# Patient Record
Sex: Female | Born: 1945 | Race: Black or African American | Hispanic: No | Marital: Married | Smoking: Former smoker
Health system: Southern US, Community
[De-identification: ages and names within clinical notes are randomized; demographics above are authoritative.]

## PROBLEM LIST (undated history)

## (undated) DIAGNOSIS — I10 Essential (primary) hypertension: Secondary | ICD-10-CM

## (undated) HISTORY — PX: ABDOMINAL HYSTERECTOMY: SHX81

## (undated) HISTORY — PX: TONSILLECTOMY: SUR1361

## (undated) HISTORY — PX: OTHER SURGICAL HISTORY: SHX169

---

## 2004-09-08 ENCOUNTER — Ambulatory Visit: Payer: Self-pay | Admitting: Internal Medicine

## 2004-09-22 ENCOUNTER — Ambulatory Visit: Payer: Self-pay | Admitting: Psychiatry

## 2004-11-02 ENCOUNTER — Ambulatory Visit: Payer: Self-pay | Admitting: Internal Medicine

## 2004-11-12 ENCOUNTER — Ambulatory Visit: Payer: Self-pay

## 2005-10-07 ENCOUNTER — Inpatient Hospital Stay: Payer: Self-pay | Admitting: Internal Medicine

## 2005-10-07 ENCOUNTER — Other Ambulatory Visit: Payer: Self-pay

## 2005-12-08 ENCOUNTER — Ambulatory Visit: Payer: Self-pay | Admitting: Internal Medicine

## 2006-11-16 ENCOUNTER — Ambulatory Visit: Payer: Self-pay | Admitting: Gastroenterology

## 2006-12-11 ENCOUNTER — Ambulatory Visit: Payer: Self-pay | Admitting: Internal Medicine

## 2006-12-22 ENCOUNTER — Ambulatory Visit: Payer: Self-pay | Admitting: Internal Medicine

## 2007-04-10 ENCOUNTER — Ambulatory Visit: Payer: Self-pay | Admitting: Cardiology

## 2007-12-12 ENCOUNTER — Ambulatory Visit: Payer: Self-pay | Admitting: Internal Medicine

## 2007-12-17 ENCOUNTER — Ambulatory Visit: Payer: Self-pay | Admitting: Internal Medicine

## 2008-08-11 ENCOUNTER — Ambulatory Visit: Payer: Self-pay | Admitting: Internal Medicine

## 2009-09-22 ENCOUNTER — Ambulatory Visit: Payer: Self-pay | Admitting: Internal Medicine

## 2010-09-24 ENCOUNTER — Ambulatory Visit: Payer: Self-pay | Admitting: Internal Medicine

## 2010-10-18 ENCOUNTER — Ambulatory Visit: Payer: Self-pay | Admitting: Gastroenterology

## 2010-11-16 ENCOUNTER — Emergency Department: Payer: Self-pay | Admitting: Emergency Medicine

## 2011-11-01 ENCOUNTER — Ambulatory Visit: Payer: Self-pay | Admitting: Internal Medicine

## 2012-04-17 ENCOUNTER — Emergency Department: Payer: Self-pay | Admitting: Emergency Medicine

## 2012-04-17 LAB — COMPREHENSIVE METABOLIC PANEL
Alkaline Phosphatase: 60 U/L (ref 50–136)
BUN: 12 mg/dL (ref 7–18)
Bilirubin,Total: 0.5 mg/dL (ref 0.2–1.0)
Chloride: 105 mmol/L (ref 98–107)
Creatinine: 0.88 mg/dL (ref 0.60–1.30)
EGFR (African American): >60
EGFR (Non-African Amer.): >60
Glucose: 101 mg/dL — ABNORMAL HIGH (ref 65–99)
Potassium: 3.8 mmol/L (ref 3.5–5.1)
SGOT(AST): 21 U/L (ref 15–37)
SGPT (ALT): 24 U/L (ref 12–78)
Total Protein: 7.8 g/dL (ref 6.4–8.2)

## 2012-04-17 LAB — URINALYSIS, COMPLETE
Blood: NEGATIVE
Ketone: NEGATIVE
Nitrite: NEGATIVE
Ph: 5 (ref 4.5–8.0)
Protein: NEGATIVE
RBC,UR: 2 /HPF (ref 0–5)
Specific Gravity: 1.023 (ref 1.003–1.030)
WBC UR: 1 /HPF (ref 0–5)

## 2012-04-17 LAB — CBC
HCT: 48.2 % — ABNORMAL HIGH (ref 35.0–47.0)
MCH: 28.7 pg (ref 26.0–34.0)
MCHC: 33.3 g/dL (ref 32.0–36.0)
MCV: 86 fL (ref 80–100)
Platelet: 223 10*3/uL (ref 150–440)
RBC: 5.59 10*6/uL — ABNORMAL HIGH (ref 3.80–5.20)
WBC: 10.6 10*3/uL (ref 3.6–11.0)

## 2012-12-13 ENCOUNTER — Ambulatory Visit: Payer: Self-pay | Admitting: Internal Medicine

## 2013-02-21 ENCOUNTER — Ambulatory Visit: Payer: Self-pay | Admitting: Internal Medicine

## 2013-10-13 DIAGNOSIS — R55 Syncope and collapse: Secondary | ICD-10-CM | POA: Insufficient documentation

## 2013-12-03 DIAGNOSIS — M519 Unspecified thoracic, thoracolumbar and lumbosacral intervertebral disc disorder: Secondary | ICD-10-CM | POA: Insufficient documentation

## 2013-12-25 ENCOUNTER — Ambulatory Visit: Payer: Self-pay | Admitting: Internal Medicine

## 2013-12-27 ENCOUNTER — Ambulatory Visit: Payer: Self-pay | Admitting: Internal Medicine

## 2014-07-31 ENCOUNTER — Inpatient Hospital Stay: Payer: Self-pay | Admitting: Internal Medicine

## 2014-07-31 LAB — COMPREHENSIVE METABOLIC PANEL
ALBUMIN: 3.3 g/dL — AB (ref 3.4–5.0)
ALK PHOS: 62 U/L (ref 46–116)
ANION GAP: 6 — AB (ref 7–16)
BILIRUBIN TOTAL: 0.4 mg/dL (ref 0.2–1.0)
BUN: 12 mg/dL (ref 7–18)
CO2: 36 mmol/L — AB (ref 21–32)
CREATININE: 0.91 mg/dL (ref 0.60–1.30)
Calcium, Total: 8.8 mg/dL (ref 8.5–10.1)
Chloride: 100 mmol/L (ref 98–107)
EGFR (African American): >60
EGFR (Non-African Amer.): >60
Glucose: 98 mg/dL (ref 65–99)
Osmolality: 283 (ref 275–301)
Potassium: 3.5 mmol/L (ref 3.5–5.1)
SGOT(AST): 28 U/L (ref 15–37)
SGPT (ALT): 19 U/L (ref 14–63)
SODIUM: 142 mmol/L (ref 136–145)
Total Protein: 7.5 g/dL (ref 6.4–8.2)

## 2014-07-31 LAB — CBC
HCT: 53.3 % — AB (ref 35.0–47.0)
HGB: 17.2 g/dL — AB (ref 12.0–16.0)
MCH: 28.5 pg (ref 26.0–34.0)
MCHC: 32.3 g/dL (ref 32.0–36.0)
MCV: 88 fL (ref 80–100)
Platelet: 210 10*3/uL (ref 150–440)
RBC: 6.04 10*6/uL — ABNORMAL HIGH (ref 3.80–5.20)
RDW: 15.5 % — ABNORMAL HIGH (ref 11.5–14.5)
WBC: 10.2 10*3/uL (ref 3.6–11.0)

## 2014-07-31 LAB — URINALYSIS, COMPLETE
Bacteria: NONE SEEN
Bilirubin,UR: NEGATIVE
Blood: NEGATIVE
GLUCOSE, UR: NEGATIVE mg/dL (ref 0–75)
KETONE: NEGATIVE
Leukocyte Esterase: NEGATIVE
Nitrite: NEGATIVE
PH: 6 (ref 4.5–8.0)
Protein: NEGATIVE
Specific Gravity: 1.006 (ref 1.003–1.030)
Squamous Epithelial: 2

## 2014-07-31 LAB — PRO B NATRIURETIC PEPTIDE: B-Type Natriuretic Peptide: 92 pg/mL (ref 0–125)

## 2014-07-31 LAB — D-DIMER(ARMC): D-Dimer: 490 ng/ml

## 2014-07-31 LAB — TROPONIN I: Troponin-I: <0.02 ng/mL

## 2014-08-01 LAB — CBC WITH DIFFERENTIAL/PLATELET
Basophil #: 0.1 10*3/uL (ref 0.0–0.1)
Basophil %: 0.4 %
Eosinophil #: 0 10*3/uL (ref 0.0–0.7)
Eosinophil %: 0 %
HCT: 53.3 % — ABNORMAL HIGH (ref 35.0–47.0)
HGB: 16.9 g/dL — AB (ref 12.0–16.0)
LYMPHS PCT: 9.2 %
Lymphocyte #: 1.2 10*3/uL (ref 1.0–3.6)
MCH: 28 pg (ref 26.0–34.0)
MCHC: 31.7 g/dL — ABNORMAL LOW (ref 32.0–36.0)
MCV: 88 fL (ref 80–100)
MONOS PCT: 1.5 %
Monocyte #: 0.2 x10 3/mm (ref 0.2–0.9)
Neutrophil #: 11.9 10*3/uL — ABNORMAL HIGH (ref 1.4–6.5)
Neutrophil %: 88.9 %
Platelet: 216 10*3/uL (ref 150–440)
RBC: 6.04 10*6/uL — ABNORMAL HIGH (ref 3.80–5.20)
RDW: 15.8 % — ABNORMAL HIGH (ref 11.5–14.5)
WBC: 13.4 10*3/uL — ABNORMAL HIGH (ref 3.6–11.0)

## 2014-08-01 LAB — BASIC METABOLIC PANEL
Anion Gap: 8 (ref 7–16)
BUN: 18 mg/dL (ref 7–18)
CALCIUM: 9.1 mg/dL (ref 8.5–10.1)
CREATININE: 1.05 mg/dL (ref 0.60–1.30)
Chloride: 98 mmol/L (ref 98–107)
Co2: 32 mmol/L (ref 21–32)
EGFR (Non-African Amer.): 55 — ABNORMAL LOW
Glucose: 157 mg/dL — ABNORMAL HIGH (ref 65–99)
OSMOLALITY: 281 (ref 275–301)
Potassium: 3.5 mmol/L (ref 3.5–5.1)
Sodium: 138 mmol/L (ref 136–145)

## 2014-11-02 NOTE — H&P (Signed)
PATIENT NAME:  Katie Caldwell, Katie Caldwell MR#:  [id number] DATE OF BIRTH:  Jun 04, 1946  DATE OF ADMISSION:  07/31/2014  PRIMARY CARE PHYSICIAN: Emily Filbert, MD, [facility]   REFERRING PHYSICIAN:  Ferman Hamming, MD from Emergency Room.   CHIEF COMPLAINT: Shortness of breath.   HISTORY OF PRESENT ILLNESS: A 69 year old female who has a history of hypertension and she is a smoker of half pack of cigarettes a day for many years now, never diagnosed with chronic obstructive pulmonary disease or any respiratory issues.  For the last few days, 3 to 4 days, she has complained of a dry cough and feeling short of breath so she called Dr. Ammie Ferrier office and went over there.  She was found to be hypoxic in the high 70s in the office and so was started on oxygen and sent to Emergency Room for further evaluation.  In the ER, her work-up came out to be negative. Chest x-ray clear, but she had some wheezing and so given respiratory treatments and given to hospitalist team for further management.  On further questioning, she denies any chest pain, fever or chills.   REVIEW OF SYSTEMS:  CONSTITUTIONAL: Negative for fever, fatigue, weakness, pain or weight loss.  EYES: No blurring, double vision, discharge or redness.  EARS, NOSE, THROAT: No tinnitus, ear pain or hearing loss.  RESPIRATORY: No cough, wheezing, hemoptysis, or shortness of breath.  CARDIOVASCULAR: No chest pain, orthopnea, edema, arrhythmia or palpitations.  GASTROINTESTINAL: No nausea, vomiting, diarrhea, or abdominal pain.  GENITOURINARY: No dysuria, hematuria, increased frequency.  ENDOCRINE: No heat or cold intolerance. No excessive sweating.  SKIN: No acne, rashes, or lesions.  MUSCULOSKELETAL: No pain or swelling in the joints.  NEUROLOGICAL: No numbness, weakness, tremor or vertigo.  PSYCHIATRIC: No anxiety, insomnia, bipolar disorder.   PAST MEDICAL HISTORY:   Hypertension.   PAST SURGICAL HISTORY: Hysterectomy long ago.   SOCIAL  HISTORY: She is a smoker, half pack of cigarettes every day. Denies drinking alcohol or doing any illegal drug use.  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  Lives with her husband.   FAMILY HISTORY: Positive for diabetes in her father.   MEDICATIONS:  1.  Lisinopril 20 mg oral once a day.  2.  Furosemide 20 mg oral once a day.  3.  Amlodipine 10 mg once a day.  4.  Acetaminophen-oxycodone 325-5 mg 2 times a day.   PHYSICAL EXAMINATION:  VITAL SIGNS: In ER, temperature 97.9, pulse is 86, respirations 18, blood pressure 155/84, pulse oximetry is 77% on room air which came up to 92% with oxygen supplementation.  GENERAL: The patient is fully alert and oriented to time, place, and person. Does not appear in any acute distress at this time.  HEENT: Head and neck atraumatic. Conjunctivae pink. Oral mucosa moist.  NECK: Supple. No JVD. Thyroid nontender.  RESPIRATORY: Bilateral equal air entry, expiratory wheezing present. No crepitation.  CARDIOVASCULAR: S1, S2 present, regular. No murmur.  ABDOMEN: Soft, nontender. Bowel sounds present. No organomegaly.  SKIN: No acne, rashes, or lesions. MUSCULOSKELETAL: No pain or swelling in the joints.  NEUROLOGICAL: No numbness, weakness. Power 5/5 in all 4 limbs. No tremor or rigidity. Follows commands.  PSYCHIATRIC: Not any acute psychiatric illness at this time.  JOINTS: No swelling or tenderness.   IMPORTANT LABORATORY RESULTS:  BNP is 92, BUN 12, creatinine 0.91, sodium 142, potassium 3.5, chloride 100 and calcium is 8.8.  Total protein is  7.5, bilirubin 0.4, alkaline phosphate 62, SGOT 28 and SGPT is 19.  Troponin less than 0.02.  WBC 10.2, hemoglobin 17.2, platelet count 110,000. MCV is 88.  D-dimer is 490.  Urinalysis is grossly negative. Chest x-ray, portable, single view, shows no acute cardiopulmonary abnormality.   ASSESSMENT AND PLAN: A 69 year old female who is a smoker but  was not diagnosed with chronic obstructive pulmonary disease in the past, came to the Emergency Room with shortness of breath sent from primary care doctor's office.  1.  Acute hypoxic respiratory failure, oxygen saturation 77% on room air. This is secondary to chronic obstructive pulmonary disease.  We will supplement oxygen currently and treat underlying cause.  2.  Chronic obstructive pulmonary disease extubation.  We will do IV steroid and nebulizer treatment as needed. This is a new diagnosis of chronic obstructive pulmonary disease, so I would recommend to follow with a pulmonologist once she recovers and this can be done as an outpatient.     3.  Hypertension. We will continue amlodipine and lisinopril as she is taking.  She was also taking furosemide, but does not have any cardiac issues as per her and she keeps drinking a lot of water because she feels thirsty after dry having to pee too much after furosemide, so I will hold furosemide and I encourage her not to drink too much water.  She understands and she will follow that.  4.  Smoking cessation counseling is done for 5 minutes and she understands will not smoke now, does not need any nicotine inhaler in the hospital.        ____________________________ Ceasar Lund. Anselm Jungling, MD vgv:DT D: 07/31/2014 13:43:57 ET T: 07/31/2014 14:01:54 ET JOB#: [id number]  cc: Ceasar Lund. Anselm Jungling, MD, <Dictator> Rusty Aus, MD  Vaughan Basta MD ELECTRONICALLY SIGNED 08/18/2014 9:58

## 2014-11-02 NOTE — Discharge Summary (Signed)
PATIENT NAME:  Katie Caldwell, WOLBERT MR#:  [id number] DATE OF BIRTH:  [birth year]  DATE OF ADMISSION:  07/31/2014 DATE OF DISCHARGE:  08/01/2014  DISCHARGE DIAGNOSES: 1. Respiratory failure.  2. Hypertension.  3. Chronic arthritis.   DISCHARGE MEDICATIONS: 1. Ceftin 250 mg b.i.d.  2. Z-Pak. 3. Prednisone taper.  4. Lisinopril 20 mg daily.  5. Furosemide 20 mg daily.  6. Amlodipine 10 mg daily.  7. Percocet 5/325 b.i.d.   REASON FOR ADMISSION: A 69 year old female presents with respiratory failure. Please see H and P for HPI, past medical history, and physical examination.   HOSPITAL COURSE: The patient was admitted. Pulse oximetry initially 77% with steroids AND antibiotics. Her lungs cleared dramatically, and chest x-ray showed no pneumonia. She will be going home with prednisone taper and antibiotics. Overall prognosis is good   ____________________________ Rusty Aus, MD mfm:mw D: 08/01/2014 08:10:07 ET T: 08/01/2014 12:20:41 ET JOB#: [id number]  cc: Rusty Aus, MD, <Dictator> Suhayb Anzalone Roselee Culver MD ELECTRONICALLY SIGNED 08/04/2014 13:19

## 2014-12-10 ENCOUNTER — Other Ambulatory Visit: Payer: Self-pay | Admitting: Internal Medicine

## 2014-12-10 DIAGNOSIS — Z1231 Encounter for screening mammogram for malignant neoplasm of breast: Secondary | ICD-10-CM

## 2014-12-29 ENCOUNTER — Other Ambulatory Visit: Payer: Self-pay | Admitting: Internal Medicine

## 2014-12-29 ENCOUNTER — Ambulatory Visit
Admission: RE | Admit: 2014-12-29 | Discharge: 2014-12-29 | Disposition: A | Discharge status: Home / Self Care | Payer: Medicare PPO | Source: Ambulatory Visit | Attending: Internal Medicine | Admitting: Internal Medicine

## 2014-12-29 DIAGNOSIS — Z1231 Encounter for screening mammogram for malignant neoplasm of breast: Secondary | ICD-10-CM | POA: Insufficient documentation

## 2015-12-25 DIAGNOSIS — E538 Deficiency of other specified B group vitamins: Secondary | ICD-10-CM | POA: Insufficient documentation

## 2015-12-30 ENCOUNTER — Other Ambulatory Visit: Payer: Self-pay | Admitting: Internal Medicine

## 2015-12-30 DIAGNOSIS — Z1231 Encounter for screening mammogram for malignant neoplasm of breast: Secondary | ICD-10-CM

## 2016-01-14 ENCOUNTER — Other Ambulatory Visit: Payer: Self-pay | Admitting: Internal Medicine

## 2016-01-14 ENCOUNTER — Ambulatory Visit
Admission: RE | Admit: 2016-01-14 | Discharge: 2016-01-14 | Disposition: A | Discharge status: Home / Self Care | Payer: Medicare PPO | Source: Ambulatory Visit | Attending: Internal Medicine | Admitting: Internal Medicine

## 2016-01-14 DIAGNOSIS — Z1231 Encounter for screening mammogram for malignant neoplasm of breast: Secondary | ICD-10-CM

## 2016-01-29 ENCOUNTER — Encounter: Payer: Self-pay | Admitting: *Deleted

## 2016-02-01 ENCOUNTER — Encounter
Admission: RE | Disposition: A | Discharge status: Home / Self Care | Payer: Self-pay | Source: Ambulatory Visit | Attending: Gastroenterology

## 2016-02-01 ENCOUNTER — Ambulatory Visit: Payer: Medicare PPO | Admitting: Certified Registered"

## 2016-02-01 ENCOUNTER — Encounter: Payer: Self-pay | Admitting: *Deleted

## 2016-02-01 ENCOUNTER — Ambulatory Visit
Admission: RE | Admit: 2016-02-01 | Discharge: 2016-02-01 | Disposition: A | Discharge status: Home / Self Care | Payer: Medicare PPO | Source: Ambulatory Visit | Attending: Gastroenterology | Admitting: Gastroenterology

## 2016-02-01 DIAGNOSIS — K644 Residual hemorrhoidal skin tags: Secondary | ICD-10-CM | POA: Insufficient documentation

## 2016-02-01 DIAGNOSIS — I10 Essential (primary) hypertension: Secondary | ICD-10-CM | POA: Diagnosis not present

## 2016-02-01 DIAGNOSIS — K5730 Diverticulosis of large intestine without perforation or abscess without bleeding: Secondary | ICD-10-CM | POA: Insufficient documentation

## 2016-02-01 DIAGNOSIS — Z86010 Personal history of colonic polyps: Secondary | ICD-10-CM | POA: Diagnosis present

## 2016-02-01 DIAGNOSIS — F17200 Nicotine dependence, unspecified, uncomplicated: Secondary | ICD-10-CM | POA: Diagnosis not present

## 2016-02-01 DIAGNOSIS — Z79899 Other long term (current) drug therapy: Secondary | ICD-10-CM | POA: Diagnosis not present

## 2016-02-01 DIAGNOSIS — K621 Rectal polyp: Secondary | ICD-10-CM | POA: Insufficient documentation

## 2016-02-01 DIAGNOSIS — J449 Chronic obstructive pulmonary disease, unspecified: Secondary | ICD-10-CM | POA: Insufficient documentation

## 2016-02-01 HISTORY — PX: COLONOSCOPY WITH PROPOFOL: SHX5780

## 2016-02-01 HISTORY — DX: Essential (primary) hypertension: I10

## 2016-02-01 SURGERY — COLONOSCOPY WITH PROPOFOL
Anesthesia: General

## 2016-02-01 MED ORDER — LIDOCAINE 2% (20 MG/ML) 5 ML SYRINGE
INTRAMUSCULAR | Status: DC | PRN
  Administered 2016-02-01: 50 mg via INTRAVENOUS

## 2016-02-01 MED ORDER — MIDAZOLAM HCL 5 MG/5ML IJ SOLN
INTRAMUSCULAR | Status: DC | PRN
  Administered 2016-02-01: 1 mg via INTRAVENOUS

## 2016-02-01 MED ORDER — SODIUM CHLORIDE 0.9 % IV SOLN
INTRAVENOUS | Status: DC
  Administered 2016-02-01: 1000 mL via INTRAVENOUS

## 2016-02-01 MED ORDER — SODIUM CHLORIDE 0.9 % IV SOLN: INTRAVENOUS | Status: DC

## 2016-02-01 MED ORDER — PROPOFOL 500 MG/50ML IV EMUL
INTRAVENOUS | Status: DC | PRN
Start: 2016-02-01 — End: 2016-02-01
  Administered 2016-02-01: 120 ug/kg/min via INTRAVENOUS

## 2016-02-01 MED ORDER — PROPOFOL 10 MG/ML IV BOLUS
INTRAVENOUS | Status: DC | PRN
  Administered 2016-02-01: 40 mg via INTRAVENOUS
  Administered 2016-02-01: 70 mg via INTRAVENOUS
  Administered 2016-02-01: 30 mg via INTRAVENOUS

## 2016-02-01 NOTE — Anesthesia Postprocedure Evaluation (Signed)
Anesthesia Post Note  Patient: Katie Caldwell  Procedure(s) Performed: Procedure(s) (LRB): COLONOSCOPY WITH PROPOFOL (N/A)  Patient location during evaluation: PACU Anesthesia Type: General Level of consciousness: awake Pain management: satisfactory to patient Vital Signs Assessment: post-procedure vital signs reviewed and stable Respiratory status: respiratory function stable Cardiovascular status: stable Anesthetic complications: no    Last Vitals:  Vitals:   02/01/16 0922 02/01/16 1151  BP: (!) 146/83 128/77  Pulse: 71 78  Resp: 20 18  Temp: (!) 36 C     Last Pain:  Vitals:   02/01/16 0922  TempSrc: Tympanic                 VAN STAVEREN,Jazmon Kos

## 2016-02-01 NOTE — H&P (Signed)
Outpatient short stay form Pre-procedure 02/01/2016 11:12 AM Lollie Sails MD  Primary Physician: Dr. Emily Filbert  Reason for visit:  Colonoscopy  History of present illness:  Patient is a 70 year old female presenting today for colonoscopy. Her last procedure was done 09/27/2010 and distended well for follow-up. He tolerated prep well. She denies use of any over-the-counter aspirin products or blood thinning agents.    Current Facility-Administered Medications:  .  0.9 %  sodium chloride infusion, , Intravenous, Continuous, Lollie Sails, MD, Last Rate: 20 mL/hr at 02/01/16 0943, 1,000 mL at 02/01/16 0943 .  0.9 %  sodium chloride infusion, , Intravenous, Continuous, Lollie Sails, MD  Prescriptions Prior to Admission  Medication Sig Dispense Refill Last Dose  . amLODipine (NORVASC) 10 MG tablet Take 10 mg by mouth daily.     . Cholecalciferol 2000 units TABS Take by mouth.     . Cyanocobalamin (VITAMIN B 12) 100 MCG LOZG Take 1,000 mcg by mouth.     . furosemide (LASIX) 20 MG tablet Take 20 mg by mouth.     . oxyCODONE-acetaminophen (PERCOCET/ROXICET) 5-325 MG tablet Take by mouth every 4 (four) hours as needed for severe pain.     Marland Kitchen triamterene-hydrochlorothiazide (DYAZIDE) 37.5-25 MG capsule Take 1 capsule by mouth daily.        Allergies  Allergen Reactions  . Micardis [Telmisartan]      Past Medical History:  Diagnosis Date  . Hypertension     Review of systems:      Physical Exam    Heart and lungs: Regular rate and rhythm without rub or gallop, lungs are bilaterally clear.    HEENT: Normocephalic atraumatic eyes are anicteric    Other:     Pertinant exam for procedure: Soft nontender nondistended bowel sounds positive normoactive.    Planned proceedures: Colonoscopy and indicated procedures. I have discussed the risks benefits and complications of procedures to include not limited to bleeding, infection, perforation and the risk of sedation and  the patient wishes to proceed.    Lollie Sails, MD Gastroenterology 02/01/2016  11:12 AM

## 2016-02-01 NOTE — Transfer of Care (Signed)
Immediate Anesthesia Transfer of Care Note  Patient: Katie Caldwell  Procedure(s) Performed: Procedure(s): COLONOSCOPY WITH PROPOFOL (N/A)  Patient Location: Endoscopy Unit  Anesthesia Type:General  Level of Consciousness: awake  Airway & Oxygen Therapy: Patient Spontanous Breathing and Patient connected to nasal cannula oxygen  Post-op Assessment: Report given to RN and Post -op Vital signs reviewed and stable  Post vital signs: Reviewed  Last Vitals:  Vitals:   02/01/16 0922 02/01/16 1151  BP: (!) 146/83 128/77  Pulse: 71 78  Resp: 20 18  Temp: (!) 36 C     Last Pain:  Vitals:   02/01/16 0922  TempSrc: Tympanic         Complications: No apparent anesthesia complications

## 2016-02-01 NOTE — Transfer of Care (Signed)
Immediate Anesthesia Transfer of Care Note  Patient: Katie Caldwell  Procedure(s) Performed: Procedure(s): COLONOSCOPY WITH PROPOFOL (N/A)  Patient Location: Endoscopy Unit  Anesthesia Type:General  Level of Consciousness: awake and alert   Airway & Oxygen Therapy: Patient Spontanous Breathing and Patient connected to nasal cannula oxygen  Post-op Assessment: Report given to RN and Post -op Vital signs reviewed and stable  Post vital signs: Reviewed  Last Vitals:  Vitals:   02/01/16 0922  BP: (!) 146/83  Pulse: 71  Resp: 20  Temp: (!) 36 C    Last Pain:  Vitals:   02/01/16 0922  TempSrc: Tympanic         Complications: No apparent anesthesia complications

## 2016-02-01 NOTE — Op Note (Signed)
[facility] Gastroenterology Patient Name: Katie Caldwell Procedure Date: 02/01/2016 11:19 AM MRN: [id number] Account #: [id number] Date of Birth: [birth year] Admit Type: Outpatient Age: 70 Room: [facility] ENDO ROOM 1 Gender: Female Note Status: Finalized Procedure:            Colonoscopy Indications:          Personal history of colonic polyps Providers:            Lollie Sails, MD Referring MD:         Rusty Aus, MD (Referring MD) Medicines:            Monitored Anesthesia Care Complications:        No immediate complications. Procedure:            Pre-Anesthesia Assessment:                       - ASA Grade Assessment: III - A patient with severe                        systemic disease.                       After obtaining informed consent, the colonoscope was                        passed under direct vision. Throughout the procedure,                        the patient's blood pressure, pulse, and oxygen                        saturations were monitored continuously. The                        Colonoscope was introduced through the anus and                        advanced to the the cecum, identified by appendiceal                        orifice and ileocecal valve. The quality of the bowel                        preparation was fair. Findings:      A less than 1 mm polyp was found in the rectum. The polyp was sessile.       The polyp was removed with a cold biopsy forceps. Resection and       retrieval were complete.      Multiple small-mouthed diverticula were found in the sigmoid colon,       descending colon, transverse colon and ascending colon.      The retroflexed view of the distal rectum and anal verge was normal and       showed no anal or rectal abnormalities.      The digital rectal exam was normal.      The perianal exam findings include skin tags. Impression:           - Preparation of the colon was fair.                       - One  less than 1 mm polyp in the rectum, removed with                        a cold biopsy forceps. Resected and retrieved.                       - Diverticulosis in the sigmoid colon, in the                        descending colon, in the transverse colon and in the                        ascending colon.                       - The distal rectum and anal verge are normal on                        retroflexion view.                       - Perianal skin tags found on perianal exam. Recommendation:       - Discharge patient to home. Procedure Code(s):    --- Professional ---                       [id number], Colonoscopy, flexible; with biopsy, single or                        multiple Diagnosis Code(s):    --- Professional ---                       K62.1, Rectal polyp                       K64.4, Residual hemorrhoidal skin tags                       Z86.010, Personal history of colonic polyps                       K57.30, Diverticulosis of large intestine without                        perforation or abscess without bleeding CPT copyright 2016 American Medical Association. All rights reserved. The codes documented in this report are preliminary and upon coder review may  be revised to meet current compliance requirements. Lollie Sails, MD 02/01/2016 11:48:08 AM This report has been signed electronically. Number of Addenda: 0 Note Initiated On: 02/01/2016 11:19 AM Scope Withdrawal Time: 0 hours 11 minutes 12 seconds  Total Procedure Duration: 0 hours 21 minutes 37 seconds       [facility]

## 2016-02-01 NOTE — Anesthesia Preprocedure Evaluation (Signed)
Anesthesia Evaluation  Patient identified by MRN, date of birth, ID band Patient awake    Reviewed: Allergy & Precautions, NPO status , Patient's Chart, lab work & pertinent test results  Airway Mallampati: III       Dental  (+) Teeth Intact   Pulmonary COPD, Current Smoker,     + decreased breath sounds      Cardiovascular hypertension, Pt. on medications  Rhythm:Regular     Neuro/Psych    GI/Hepatic Neg liver ROS,   Endo/Other  Morbid obesity  Renal/GU      Musculoskeletal negative musculoskeletal ROS (+)   Abdominal (+) + obese,   Peds  Hematology   Anesthesia Other Findings   Reproductive/Obstetrics                             Anesthesia Physical Anesthesia Plan  ASA: III  Anesthesia Plan: General   Post-op Pain Management:    Induction: Intravenous  Airway Management Planned: Natural Airway and Nasal Cannula  Additional Equipment:   Intra-op Plan:   Post-operative Plan:   Informed Consent: I have reviewed the patients History and Physical, chart, labs and discussed the procedure including the risks, benefits and alternatives for the proposed anesthesia with the patient or authorized representative who has indicated his/her understanding and acceptance.     Plan Discussed with: CRNA  Anesthesia Plan Comments:         Anesthesia Quick Evaluation

## 2016-02-02 ENCOUNTER — Encounter: Payer: Self-pay | Admitting: Gastroenterology

## 2016-02-02 LAB — SURGICAL PATHOLOGY

## 2016-12-05 DIAGNOSIS — E782 Mixed hyperlipidemia: Secondary | ICD-10-CM | POA: Insufficient documentation

## 2017-01-10 ENCOUNTER — Other Ambulatory Visit: Payer: Self-pay | Admitting: Internal Medicine

## 2017-01-10 DIAGNOSIS — Z1231 Encounter for screening mammogram for malignant neoplasm of breast: Secondary | ICD-10-CM

## 2017-01-20 ENCOUNTER — Ambulatory Visit
Admission: RE | Admit: 2017-01-20 | Discharge: 2017-01-20 | Disposition: A | Discharge status: Home / Self Care | Payer: Medicare PPO | Source: Ambulatory Visit | Attending: Internal Medicine | Admitting: Internal Medicine

## 2017-01-20 DIAGNOSIS — Z1231 Encounter for screening mammogram for malignant neoplasm of breast: Secondary | ICD-10-CM | POA: Insufficient documentation

## 2018-01-25 ENCOUNTER — Other Ambulatory Visit: Payer: Self-pay | Admitting: Internal Medicine

## 2018-01-25 DIAGNOSIS — Z1231 Encounter for screening mammogram for malignant neoplasm of breast: Secondary | ICD-10-CM

## 2018-02-13 ENCOUNTER — Ambulatory Visit
Admission: RE | Admit: 2018-02-13 | Discharge: 2018-02-13 | Disposition: A | Discharge status: Home / Self Care | Payer: Medicare PPO | Source: Ambulatory Visit | Attending: Internal Medicine | Admitting: Internal Medicine

## 2018-02-13 DIAGNOSIS — Z1231 Encounter for screening mammogram for malignant neoplasm of breast: Secondary | ICD-10-CM | POA: Insufficient documentation

## 2018-02-15 ENCOUNTER — Other Ambulatory Visit: Payer: Self-pay | Admitting: Internal Medicine

## 2018-02-15 DIAGNOSIS — N6320 Unspecified lump in the left breast, unspecified quadrant: Secondary | ICD-10-CM

## 2018-02-15 DIAGNOSIS — R928 Other abnormal and inconclusive findings on diagnostic imaging of breast: Secondary | ICD-10-CM

## 2018-03-20 ENCOUNTER — Ambulatory Visit
Admission: RE | Admit: 2018-03-20 | Discharge: 2018-03-20 | Disposition: A | Discharge status: Home / Self Care | Payer: Medicare PPO | Source: Ambulatory Visit | Attending: Internal Medicine | Admitting: Internal Medicine

## 2018-03-20 DIAGNOSIS — N6320 Unspecified lump in the left breast, unspecified quadrant: Secondary | ICD-10-CM | POA: Insufficient documentation

## 2018-03-20 DIAGNOSIS — R928 Other abnormal and inconclusive findings on diagnostic imaging of breast: Secondary | ICD-10-CM

## 2019-07-11 DIAGNOSIS — E559 Vitamin D deficiency, unspecified: Secondary | ICD-10-CM | POA: Insufficient documentation

## 2019-07-11 DIAGNOSIS — C4371 Malignant melanoma of right lower limb, including hip: Secondary | ICD-10-CM

## 2019-07-11 HISTORY — DX: Malignant melanoma of right lower limb, including hip: C43.71

## 2019-12-30 ENCOUNTER — Inpatient Hospital Stay
Admission: EM | Admit: 2019-12-30 | Discharge: 2020-01-03 | DRG: 640 | Disposition: A | Discharge status: Home Health | Payer: Medicare PPO | Source: Ambulatory Visit | Attending: Internal Medicine | Admitting: Internal Medicine

## 2019-12-30 DIAGNOSIS — E878 Other disorders of electrolyte and fluid balance, not elsewhere classified: Secondary | ICD-10-CM | POA: Diagnosis present

## 2019-12-30 DIAGNOSIS — F10231 Alcohol dependence with withdrawal delirium: Secondary | ICD-10-CM | POA: Diagnosis not present

## 2019-12-30 DIAGNOSIS — Z90710 Acquired absence of both cervix and uterus: Secondary | ICD-10-CM

## 2019-12-30 DIAGNOSIS — R1319 Other dysphagia: Secondary | ICD-10-CM

## 2019-12-30 DIAGNOSIS — G8929 Other chronic pain: Secondary | ICD-10-CM | POA: Diagnosis present

## 2019-12-30 DIAGNOSIS — Z6824 Body mass index (BMI) 24.0-24.9, adult: Secondary | ICD-10-CM

## 2019-12-30 DIAGNOSIS — I248 Other forms of acute ischemic heart disease: Secondary | ICD-10-CM | POA: Diagnosis present

## 2019-12-30 DIAGNOSIS — Z803 Family history of malignant neoplasm of breast: Secondary | ICD-10-CM

## 2019-12-30 DIAGNOSIS — K2920 Alcoholic gastritis without bleeding: Secondary | ICD-10-CM | POA: Diagnosis present

## 2019-12-30 DIAGNOSIS — E876 Hypokalemia: Secondary | ICD-10-CM | POA: Diagnosis not present

## 2019-12-30 DIAGNOSIS — I10 Essential (primary) hypertension: Secondary | ICD-10-CM

## 2019-12-30 DIAGNOSIS — E860 Dehydration: Secondary | ICD-10-CM | POA: Diagnosis present

## 2019-12-30 DIAGNOSIS — E785 Hyperlipidemia, unspecified: Secondary | ICD-10-CM | POA: Diagnosis present

## 2019-12-30 DIAGNOSIS — R778 Other specified abnormalities of plasma proteins: Secondary | ICD-10-CM

## 2019-12-30 DIAGNOSIS — F17220 Nicotine dependence, chewing tobacco, uncomplicated: Secondary | ICD-10-CM | POA: Diagnosis present

## 2019-12-30 DIAGNOSIS — Z20822 Contact with and (suspected) exposure to covid-19: Secondary | ICD-10-CM | POA: Diagnosis present

## 2019-12-30 DIAGNOSIS — Q398 Other congenital malformations of esophagus: Secondary | ICD-10-CM

## 2019-12-30 DIAGNOSIS — K760 Fatty (change of) liver, not elsewhere classified: Secondary | ICD-10-CM | POA: Diagnosis present

## 2019-12-30 DIAGNOSIS — Z79899 Other long term (current) drug therapy: Secondary | ICD-10-CM

## 2019-12-30 DIAGNOSIS — N179 Acute kidney failure, unspecified: Secondary | ICD-10-CM | POA: Diagnosis present

## 2019-12-30 DIAGNOSIS — R531 Weakness: Secondary | ICD-10-CM | POA: Diagnosis present

## 2019-12-30 DIAGNOSIS — K219 Gastro-esophageal reflux disease without esophagitis: Secondary | ICD-10-CM | POA: Diagnosis present

## 2019-12-30 DIAGNOSIS — M5136 Other intervertebral disc degeneration, lumbar region: Secondary | ICD-10-CM | POA: Diagnosis present

## 2019-12-30 DIAGNOSIS — E46 Unspecified protein-calorie malnutrition: Secondary | ICD-10-CM | POA: Diagnosis present

## 2019-12-30 DIAGNOSIS — G9341 Metabolic encephalopathy: Secondary | ICD-10-CM | POA: Diagnosis not present

## 2019-12-30 DIAGNOSIS — R634 Abnormal weight loss: Secondary | ICD-10-CM | POA: Diagnosis present

## 2019-12-30 DIAGNOSIS — R109 Unspecified abdominal pain: Secondary | ICD-10-CM

## 2019-12-30 DIAGNOSIS — R7989 Other specified abnormal findings of blood chemistry: Secondary | ICD-10-CM | POA: Diagnosis present

## 2019-12-30 DIAGNOSIS — R1314 Dysphagia, pharyngoesophageal phase: Secondary | ICD-10-CM | POA: Diagnosis present

## 2019-12-30 DIAGNOSIS — Z888 Allergy status to other drugs, medicaments and biological substances status: Secondary | ICD-10-CM

## 2019-12-30 DIAGNOSIS — Z01818 Encounter for other preprocedural examination: Secondary | ICD-10-CM

## 2019-12-30 LAB — CBC WITH DIFFERENTIAL/PLATELET
Abs Immature Granulocytes: 0.05 10*3/uL (ref 0.00–0.07)
Basophils Absolute: 0 10*3/uL (ref 0.0–0.1)
Basophils Relative: 1 %
Eosinophils Absolute: 0 10*3/uL (ref 0.0–0.5)
Eosinophils Relative: 0 %
HCT: 35.7 % — ABNORMAL LOW (ref 36.0–46.0)
Hemoglobin: 12.6 g/dL (ref 12.0–15.0)
Immature Granulocytes: 1 %
Lymphocytes Relative: 29 %
Lymphs Abs: 2.4 10*3/uL (ref 0.7–4.0)
MCH: 31.1 pg (ref 26.0–34.0)
MCHC: 35.3 g/dL (ref 30.0–36.0)
MCV: 88.1 fL (ref 80.0–100.0)
Monocytes Absolute: 0.7 10*3/uL (ref 0.1–1.0)
Monocytes Relative: 9 %
Neutro Abs: 5.2 10*3/uL (ref 1.7–7.7)
Neutrophils Relative %: 60 %
Platelets: 240 10*3/uL (ref 150–400)
RBC: 4.05 MIL/uL (ref 3.87–5.11)
RDW: 13.5 % (ref 11.5–15.5)
WBC: 8.5 10*3/uL (ref 4.0–10.5)
nRBC: 0 % (ref 0.0–0.2)

## 2019-12-30 LAB — MAGNESIUM: Magnesium: 1.4 mg/dL — ABNORMAL LOW (ref 1.7–2.4)

## 2019-12-30 LAB — BASIC METABOLIC PANEL
Anion gap: 21 — ABNORMAL HIGH (ref 5–15)
BUN: 22 mg/dL (ref 8–23)
CO2: 35 mmol/L — ABNORMAL HIGH (ref 22–32)
Calcium: 8.5 mg/dL — ABNORMAL LOW (ref 8.9–10.3)
Chloride: 79 mmol/L — ABNORMAL LOW (ref 98–111)
Creatinine, Ser: 1.61 mg/dL — ABNORMAL HIGH (ref 0.44–1.00)
GFR calc Af Amer: 36 mL/min — ABNORMAL LOW (ref >60)
GFR calc non Af Amer: 31 mL/min — ABNORMAL LOW (ref >60)
Glucose, Bld: 114 mg/dL — ABNORMAL HIGH (ref 70–99)
Potassium: <2 mmol/L — CL (ref 3.5–5.1)
Sodium: 135 mmol/L (ref 135–145)

## 2019-12-30 LAB — TROPONIN I (HIGH SENSITIVITY)
Troponin I (High Sensitivity): 70 ng/L — ABNORMAL HIGH (ref <18)
Troponin I (High Sensitivity): 64 ng/L — ABNORMAL HIGH (ref <18)

## 2019-12-30 MED ORDER — POTASSIUM CHLORIDE 10 MEQ/100ML IV SOLN
10.0000 meq | INTRAVENOUS | Status: AC
  Administered 2019-12-30 (×4): 10 meq via INTRAVENOUS
  Filled 2019-12-30 (×3): qty 100

## 2019-12-30 MED ORDER — VITAMIN B-12 1000 MCG PO TABS
1000.0000 ug | ORAL_TABLET | Freq: Every day | ORAL | Status: DC
  Administered 2019-12-31 – 2020-01-03 (×4): 1000 ug via ORAL
  Filled 2019-12-30 (×4): qty 1

## 2019-12-30 MED ORDER — LACTATED RINGERS IV BOLUS
1000.0000 mL | Freq: Once | INTRAVENOUS | Status: AC
  Administered 2019-12-30: 19:00:00 1000 mL via INTRAVENOUS

## 2019-12-30 MED ORDER — POTASSIUM CHLORIDE 2 MEQ/ML IV SOLN
INTRAVENOUS | Status: DC
  Filled 2019-12-30 (×2): qty 1000

## 2019-12-30 MED ORDER — POTASSIUM CHLORIDE 20 MEQ/15ML (10%) PO SOLN
60.0000 meq | Freq: Once | ORAL | Status: AC
  Administered 2019-12-31: 60 meq via ORAL
  Filled 2019-12-30: qty 45

## 2019-12-30 MED ORDER — ONDANSETRON HCL 4 MG/2ML IJ SOLN: 4.0000 mg | Freq: Four times a day (QID) | INTRAMUSCULAR | Status: DC | PRN

## 2019-12-30 MED ORDER — KCL-LACTATED RINGERS-D5W 20 MEQ/L IV SOLN
INTRAVENOUS | Status: DC
  Filled 2019-12-30 (×13): qty 1000

## 2019-12-30 MED ORDER — ACETAMINOPHEN 325 MG PO TABS: 650.0000 mg | ORAL_TABLET | Freq: Four times a day (QID) | ORAL | Status: DC | PRN

## 2019-12-30 MED ORDER — ROSUVASTATIN CALCIUM 10 MG PO TABS
10.0000 mg | ORAL_TABLET | Freq: Every day | ORAL | Status: DC
  Administered 2019-12-31 – 2020-01-02 (×4): 10 mg via ORAL
  Filled 2019-12-30 (×5): qty 1

## 2019-12-30 MED ORDER — POTASSIUM CHLORIDE 2 MEQ/ML IV SOLN: INTRAVENOUS | Status: DC

## 2019-12-30 MED ORDER — SODIUM CHLORIDE 0.9% FLUSH
3.0000 mL | Freq: Two times a day (BID) | INTRAVENOUS | Status: DC
  Administered 2019-12-31 – 2020-01-02 (×4): 3 mL via INTRAVENOUS

## 2019-12-30 MED ORDER — OXYCODONE-ACETAMINOPHEN 5-325 MG PO TABS
1.0000 | ORAL_TABLET | Freq: Every day | ORAL | Status: DC
  Administered 2020-01-01 – 2020-01-03 (×3): 1 via ORAL
  Filled 2019-12-30 (×4): qty 1

## 2019-12-30 MED ORDER — POTASSIUM CHLORIDE CRYS ER 20 MEQ PO TBCR
40.0000 meq | EXTENDED_RELEASE_TABLET | Freq: Once | ORAL | Status: AC
  Administered 2019-12-30: 19:00:00 40 meq via ORAL
  Filled 2019-12-30: qty 2

## 2019-12-30 MED ORDER — HEPARIN SODIUM (PORCINE) 5000 UNIT/ML IJ SOLN
5000.0000 [IU] | Freq: Three times a day (TID) | INTRAMUSCULAR | Status: DC
Start: 2019-12-30 — End: 2020-01-01
  Administered 2019-12-31 (×4): 5000 [IU] via SUBCUTANEOUS
  Filled 2019-12-30 (×4): qty 1

## 2019-12-30 MED ORDER — ONDANSETRON HCL 4 MG PO TABS: 4.0000 mg | ORAL_TABLET | Freq: Four times a day (QID) | ORAL | Status: DC | PRN

## 2019-12-30 MED ORDER — ACETAMINOPHEN 650 MG RE SUPP: 650.0000 mg | Freq: Four times a day (QID) | RECTAL | Status: DC | PRN

## 2019-12-30 MED ORDER — MAGNESIUM SULFATE 2 GM/50ML IV SOLN
2.0000 g | Freq: Once | INTRAVENOUS | Status: AC
  Administered 2019-12-30: 19:00:00 2 g via INTRAVENOUS
  Filled 2019-12-30: qty 50

## 2019-12-30 NOTE — ED Notes (Signed)
Pt up to bedside toilet. Steady.  

## 2019-12-30 NOTE — ED Notes (Signed)
Lab called this RN to confirm potassium less than 2. EDP [place] already aware.

## 2019-12-30 NOTE — ED Provider Notes (Signed)
[facility] Emergency Department Provider Note   ____________________________________________   First MD Initiated Contact with Patient 12/30/19 1844     (approximate)  I have reviewed the triage vital signs and the nursing notes.   HISTORY  Chief Complaint Hypokalemia    HPI Katie Caldwell is a 74 y.o. female with past medical history of hypertension who presents to the ED complaining of hypokalemia.  Patient states that she initially presented to her PCPs office earlier today for loss of about 30 pounds over the past couple of months along with lightheadedness and dizziness.  She then received a call later this afternoon that her potassium level was less than 2 and she needed to go to the ER.  She denies any fevers, cough, chest pain, shortness of breath, vomiting, diarrhea, dysuria, or hematuria.  She states she has been urinating a normal amount and has not had any recent changes to her medications.  She does take Lasix and hydrochlorothiazide for her blood pressure.        Past Medical History:  Diagnosis Date  . Hypertension     There are no problems to display for this patient.   Past Surgical History:  Procedure Laterality Date  . ABDOMINAL HYSTERECTOMY    . bladder surgery    . COLONOSCOPY WITH PROPOFOL N/A 02/01/2016   Procedure: COLONOSCOPY WITH PROPOFOL;  Surgeon: Lollie Sails, MD;  Location: [facility] ENDOSCOPY;  Service: Endoscopy;  Laterality: N/A;  . TONSILLECTOMY      Prior to Admission medications   Medication Sig Start Date End Date Taking? Authorizing Provider  amLODipine (NORVASC) 10 MG tablet Take 10 mg by mouth daily.    [provider]  Cholecalciferol 2000 units TABS Take by mouth.    [provider]  Cyanocobalamin (VITAMIN B 12) 100 MCG LOZG Take 1,000 mcg by mouth.    [provider]  furosemide (LASIX) 20 MG tablet Take 20 mg by mouth.    [provider]   oxyCODONE-acetaminophen (PERCOCET/ROXICET) 5-325 MG tablet Take by mouth every 4 (four) hours as needed for severe pain.    [provider]  triamterene-hydrochlorothiazide (DYAZIDE) 37.5-25 MG capsule Take 1 capsule by mouth daily.    [provider]    Allergies Micardis [telmisartan]  Family History  Problem Relation Age of Onset  . Breast cancer Sister 1    Social History Social History   Tobacco Use  . Smoking status: Former Research scientist (life sciences)  . Smokeless tobacco: Current User  Substance Use Topics  . Alcohol use: Not Currently  . Drug use: Never    Review of Systems  Constitutional: No fever/chills.  Positive for lightheadedness and generalized weakness. Eyes: No visual changes. ENT: No sore throat. Cardiovascular: Denies chest pain. Respiratory: Denies shortness of breath. Gastrointestinal: No abdominal pain.  No nausea, no vomiting.  No diarrhea.  No constipation. Genitourinary: Negative for dysuria. Musculoskeletal: Negative for back pain. Skin: Negative for rash. Neurological: Negative for headaches, focal weakness or numbness.  ____________________________________________   PHYSICAL EXAM:  VITAL SIGNS: ED Triage Vitals  Enc Vitals Group     BP 12/30/19 1816 (!) 86/48     Pulse Rate 12/30/19 1816 72     Resp 12/30/19 1816 18     Temp 12/30/19 1816 97.8 F (36.6 C)     Temp Source 12/30/19 1816 Oral     SpO2 12/30/19 1816 96 %     Weight 12/30/19 1810 149 lb (67.6 kg)  Height 12/30/19 1810 5\' 5"  (1.651 m)     Head Circumference --      Peak Flow --      Pain Score 12/30/19 1810 0     Pain Loc --      Pain Edu? --      Excl. in [place]? --     Constitutional: Alert and oriented. Eyes: Conjunctivae are normal. Head: Atraumatic. Nose: No congestion/rhinnorhea. Mouth/Throat: Mucous membranes are moist. Neck: Normal ROM Cardiovascular: Normal rate, regular rhythm. Grossly normal heart sounds. Respiratory: Normal respiratory effort.  No  retractions. Lungs CTAB. Gastrointestinal: Soft and nontender. No distention. Genitourinary: deferred Musculoskeletal: No lower extremity tenderness nor edema. Neurologic:  Normal speech and language. No gross focal neurologic deficits are appreciated. Skin:  Skin is warm, dry and intact. No rash noted. Psychiatric: Mood and affect are normal. Speech and behavior are normal.  ____________________________________________   LABS (all labs ordered are listed, but only abnormal results are displayed)  Labs Reviewed  CBC WITH DIFFERENTIAL/PLATELET - Abnormal; Notable for the following components:      Result Value   HCT 35.7 (*)    All other components within normal limits  BASIC METABOLIC PANEL - Abnormal; Notable for the following components:   Potassium <2.0 (*)    Chloride 79 (*)    CO2 35 (*)    Glucose, Bld 114 (*)    Creatinine, Ser 1.61 (*)    Calcium 8.5 (*)    GFR calc non Af Amer 31 (*)    GFR calc Af Amer 36 (*)    Anion gap 21 (*)    All other components within normal limits  MAGNESIUM - Abnormal; Notable for the following components:   Magnesium 1.4 (*)    All other components within normal limits  TROPONIN I (HIGH SENSITIVITY) - Abnormal; Notable for the following components:   Troponin I (High Sensitivity) 70 (*)    All other components within normal limits  SARS CORONAVIRUS 2 (TAT 6-24 HRS)   ____________________________________________  EKG  ED ECG REPORT I, Blake Divine, the attending physician, personally viewed and interpreted this ECG.   Date: 12/30/2019  EKG Time: 18:29  Rate: 58  Rhythm: normal sinus rhythm  Axis: Normal  Intervals:Prolonged QT  ST&T Change: Lateral ST depressions and U wave   PROCEDURES  Procedure(s) performed (including Critical Care):  Procedures   ____________________________________________   INITIAL IMPRESSION / ASSESSMENT AND PLAN / ED COURSE       74 year old female presents to the ED after outpatient  labs showed a potassium of less than 2.  EKG shows prolonged QT, ST depressions, and U wave consistent with changes related to hypokalemia.  I am able to see in care everywhere that her potassium was 1.7 and magnesium also low, we will replete potassium and magnesium.  Her electrolyte abnormalities are likely due to her diuretic regimen and we will also hydrate with IV fluids.  Labs confirm hypokalemia and hypomagnesemia, also significant for AKI.  Patient's blood pressure is improving following IV fluid bolus and she is tolerating p.o. potassium without difficulty, also receiving IV potassium and magnesium.  Case was discussed with hospitalist for admission.      ____________________________________________   FINAL CLINICAL IMPRESSION(S) / ED DIAGNOSES  Final diagnoses:  Hypokalemia  Hypomagnesemia  AKI (acute kidney injury) [facility])     ED Discharge Orders    None       Note:  This document was prepared using Dragon voice recognition software and  may include unintentional dictation errors.   Blake Divine, MD 12/30/19 1944

## 2019-12-30 NOTE — ED Notes (Signed)
Admitting provider at bedside speaking with pt and her daughter. Pt given a drink. Denies any other needs. Bed locked low. Rail up. Call bell within reach.

## 2019-12-30 NOTE — ED Notes (Signed)
Rate of potassium dec to 75 to make tolerable for pt.

## 2019-12-30 NOTE — ED Notes (Signed)
Pt given food tray and drink as stated now hungry. Visitor at bedside.

## 2019-12-30 NOTE — H&P (Signed)
History and Physical    Katie Caldwell:[id number] DOB: 11/03/1945 DOA: 12/30/2019  PCP: Rusty Aus, MD  Patient coming from: Home  I have personally briefly reviewed patient's old medical records in [place]  Chief Complaint: Hypokalemia  HPI: Katie Caldwell is a 74 y.o. female with medical history significant for hypertension, hyperlipidemia, and chronic back pain with lumbar disc disease who presents to the ED for evaluation of abnormal labs.  Daughter is at bedside to provide additional history.  Patient was seen by her PCP earlier today on 12/30/2019 for evaluation of significant weight loss, shortness of breath, poor appetite.  Labs were obtained and she was found to have significant hypokalemia of 1.7, magnesium 1.4, and new AKI with creatinine up to 1.5.  She was called to present to the ED for further evaluation and management.  Her daughter came down from [place] to visit and noticed significant weight loss since the last time she saw her mother.  Patient has an apparent 30 pound weight loss over the last 2 months or so.  She says she has had no appetite and says she has lost her taste for foods.  She has had progressive generalized weakness and fatigue.  She becomes easily tired and short of breath with walking short distances.  She denies any abdominal pain, nausea, vomiting, diarrhea, chest pain, dyspnea, or dysuria.  She has not had any recent medication changes.  She says she has been trying to keep up with her hydration by drinking plenty of water.  ED Course:  Initial vitals showed BP 86/48, pulse 66, RR 19, temp 97.8 Fahrenheit, SPO2 95% on room air.  Labs are notable for K <2.0, sodium 135, chloride 79, bicarb 35, BUN 22, creatinine 1.61, serum glucose 114, magnesium 1.4, WBC 8.5, hemoglobin 12.6, platelets 240,000, high-sensitivity troponin I 70 >> 64.  SARS-CoV-2 PCR is obtained and pending.  Patient was given 1 L LR, IV magnesium 2 g, K 40 mEq  orally and IV K 10 mEq x 4.  The hospitalist service was consulted to admit for further evaluation and management.  Review of Systems: All systems reviewed and are negative except as documented in history of present illness above.   Past Medical History:  Diagnosis Date  . Hypertension     Past Surgical History:  Procedure Laterality Date  . ABDOMINAL HYSTERECTOMY    . bladder surgery    . COLONOSCOPY WITH PROPOFOL N/A 02/01/2016   Procedure: COLONOSCOPY WITH PROPOFOL;  Surgeon: Lollie Sails, MD;  Location: [facility]COPY;  Service: Endoscopy;  Laterality: N/A;  . TONSILLECTOMY      Social History:  reports that she has quit smoking. She uses smokeless tobacco. She reports previous alcohol use. She reports that she does not use drugs.  Allergies  Allergen Reactions  . Micardis [Telmisartan]     Family History  Problem Relation Age of Onset  . Breast cancer Sister 20     Prior to Admission medications   Medication Sig Start Date End Date Taking? Authorizing Provider  amLODipine (NORVASC) 10 MG tablet Take 10 mg by mouth daily.    [provider]  Cholecalciferol 2000 units TABS Take by mouth.    [provider]  Cyanocobalamin (VITAMIN B 12) 100 MCG LOZG Take 1,000 mcg by mouth.    [provider]  furosemide (LASIX) 20 MG tablet Take 20 mg by mouth.    [provider]  oxyCODONE-acetaminophen (PERCOCET/ROXICET) 5-325 MG tablet Take  by mouth every 4 (four) hours as needed for severe pain.    [provider]  triamterene-hydrochlorothiazide (DYAZIDE) 37.5-25 MG capsule Take 1 capsule by mouth daily.    [provider]    Physical Exam: Vitals:   12/30/19 1845 12/30/19 1852 12/30/19 1915 12/30/19 2001  BP:      Pulse:  66 64 90  Resp: 19 19 15  (!) 21  Temp:      TempSrc:      SpO2:  95% 96% 96%  Weight:      Height:       Constitutional: Elderly woman resting supine in bed, NAD, calm, comfortable Eyes:  PERRL, lids and conjunctivae normal ENMT: Mucous membranes are dry. Posterior pharynx clear of any exudate or lesions. Neck: normal, supple, no masses. Respiratory: clear to auscultation bilaterally, no wheezing, no crackles. Normal respiratory effort. No accessory muscle use.  Cardiovascular: Regular rate and rhythm, no murmurs / rubs / gallops. No extremity edema. 2+ pedal pulses. Abdomen: no tenderness, no masses palpated. No hepatosplenomegaly. Bowel sounds positive.  Musculoskeletal: no clubbing / cyanosis. No joint deformity upper and lower extremities. Good ROM, no contractures. Normal muscle tone.  Skin: no rashes, lesions, ulcers. No induration Neurologic: CN 2-12 grossly intact. Sensation intact, Strength 5/5 in all 4.  Psychiatric: Normal judgment and insight. Alert and oriented x 3. Normal mood.   Labs on Admission: I have personally reviewed following labs and imaging studies  CBC: Recent Labs  Lab 12/30/19 1830  WBC 8.5  NEUTROABS 5.2  HGB 12.6  HCT 35.7*  MCV 88.1  PLT 664   Basic Metabolic Panel: Recent Labs  Lab 12/30/19 1830  NA 135  K <2.0*  CL 79*  CO2 35*  GLUCOSE 114*  BUN 22  CREATININE 1.61*  CALCIUM 8.5*  MG 1.4*   GFR: Estimated Creatinine Clearance: 27.6 mL/min (A) (by C-G formula based on SCr of 1.61 mg/dL (H)). Liver Function Tests: No results for input(s): AST, ALT, ALKPHOS, BILITOT, PROT, ALBUMIN in the last 168 hours. No results for input(s): LIPASE, AMYLASE in the last 168 hours. No results for input(s): AMMONIA in the last 168 hours. Coagulation Profile: No results for input(s): INR, PROTIME in the last 168 hours. Cardiac Enzymes: No results for input(s): CKTOTAL, CKMB, CKMBINDEX, TROPONINI in the last 168 hours. BNP (last 3 results) No results for input(s): PROBNP in the last 8760 hours. HbA1C: No results for input(s): HGBA1C in the last 72 hours. CBG: No results for input(s): GLUCAP in the last 168 hours. Lipid Profile: No  results for input(s): CHOL, HDL, LDLCALC, TRIG, CHOLHDL, LDLDIRECT in the last 72 hours. Thyroid Function Tests: No results for input(s): TSH, T4TOTAL, FREET4, T3FREE, THYROIDAB in the last 72 hours. Anemia Panel: No results for input(s): VITAMINB12, FOLATE, FERRITIN, TIBC, IRON, RETICCTPCT in the last 72 hours. Urine analysis:    Component Value Date/Time   COLORURINE Straw 07/31/2014 1107   APPEARANCEUR Clear 07/31/2014 1107   LABSPEC 1.006 07/31/2014 1107   PHURINE 6.0 07/31/2014 1107   GLUCOSEU Negative 07/31/2014 1107   HGBUR Negative 07/31/2014 1107   BILIRUBINUR Negative 07/31/2014 1107   KETONESUR Negative 07/31/2014 1107   PROTEINUR Negative 07/31/2014 1107   NITRITE Negative 07/31/2014 1107   LEUKOCYTESUR Negative 07/31/2014 1107    Radiological Exams on Admission: No results found.  EKG: Independently reviewed. Sinus rhythm, RAE, QTc 497, motion artifact present.  QTC longer when compared to prior.  Assessment/Plan Principal Problem:   Hypokalemia Active Problems:  Hypomagnesemia   AKI (acute kidney injury) ([place])   Elevated troponin   Hypertension  CHANNING SAVICH is a 74 y.o. female with medical history significant for hypertension, hyperlipidemia, and chronic back pain with lumbar disc disease who is admitted with severe hypokalemia.  Hypokalemia/hypomagnesemia: Suspect from nutritional deficiency due to poor appetite although not clear why appetite has been low.  Magnesium and potassium repleted in the ED.  Give additional oral potassium supplement and add potassium to fluids.  Repeat labs in the morning.  Acute kidney injury: Creatinine 1.61 on admission, previously 0.7 on 07/04/2019.  Will continue IV fluid hydration and repeat labs in the morning.  Elevated troponin: Troponin minimally elevated at 70 and trending down with repeat of 64.  She denies any chest pain.  Nonspecific ST changes attributed to hypokalemia.  Hypertension: Blood pressure has  been low therefore we will hold home amlodipine and lisinopril.  Chronic back pain: Continue home Percocet with hold parameters.  DVT prophylaxis: Subcutaneous heparin Code Status: Full code, confirmed with patient Family Communication: Discussed with daughter at bedside Disposition Plan: From home and likely discharge to home pending adequate electrolyte correction Consults called: None Admission status:  Status is: Observation  The patient remains OBS appropriate and will d/c before 2 midnights.  Dispo: The patient is from: Home              Anticipated d/c is to: Home              Anticipated d/c date is: 1 day pending adequate electrolyte correction.              Patient currently is not medically stable to d/c.  Zada Finders MD [facility]  If 7PM-7AM, please contact night-coverage [email or web address]  12/30/2019, 8:21 PM

## 2019-12-30 NOTE — ED Triage Notes (Signed)
Pt sent to the er by PCP for potassium of <2. Pt reports extreme weight loss and dizziness.

## 2019-12-30 NOTE — ED Notes (Signed)
Verbal okay from [place] to offer pt food/drink. Pt has water. Pt declined food. States has no appetite.

## 2019-12-30 NOTE — ED Notes (Signed)
Pt back in stretcher. Pt's daughter leaving bedside. Bed locked low. Hob adjusted for pt. Rail up. Call bell within reach. Personal items within reach. Lights dimmed for pt.

## 2019-12-30 NOTE — ED Notes (Signed)
EDP [place] at bedside 

## 2019-12-30 NOTE — ED Notes (Signed)
Pt in with c/o dizziness; denies HA, CP, nausea, and SOB. Pt resting calmly in bed. A&Ox4. Resp reg/unlabored. Pt on cardiac monitor; NSR with occasional PVCs.

## 2019-12-31 ENCOUNTER — Inpatient Hospital Stay: Payer: Medicare PPO

## 2019-12-31 ENCOUNTER — Other Ambulatory Visit: Payer: Self-pay

## 2019-12-31 DIAGNOSIS — Z888 Allergy status to other drugs, medicaments and biological substances status: Secondary | ICD-10-CM | POA: Diagnosis not present

## 2019-12-31 DIAGNOSIS — N179 Acute kidney failure, unspecified: Secondary | ICD-10-CM

## 2019-12-31 DIAGNOSIS — I10 Essential (primary) hypertension: Secondary | ICD-10-CM

## 2019-12-31 DIAGNOSIS — Z6824 Body mass index (BMI) 24.0-24.9, adult: Secondary | ICD-10-CM | POA: Diagnosis not present

## 2019-12-31 DIAGNOSIS — M5136 Other intervertebral disc degeneration, lumbar region: Secondary | ICD-10-CM | POA: Diagnosis present

## 2019-12-31 DIAGNOSIS — K2920 Alcoholic gastritis without bleeding: Secondary | ICD-10-CM | POA: Diagnosis present

## 2019-12-31 DIAGNOSIS — Z803 Family history of malignant neoplasm of breast: Secondary | ICD-10-CM | POA: Diagnosis not present

## 2019-12-31 DIAGNOSIS — K219 Gastro-esophageal reflux disease without esophagitis: Secondary | ICD-10-CM | POA: Diagnosis present

## 2019-12-31 DIAGNOSIS — E876 Hypokalemia: Secondary | ICD-10-CM | POA: Diagnosis present

## 2019-12-31 DIAGNOSIS — E785 Hyperlipidemia, unspecified: Secondary | ICD-10-CM | POA: Diagnosis present

## 2019-12-31 DIAGNOSIS — R1310 Dysphagia, unspecified: Secondary | ICD-10-CM | POA: Diagnosis not present

## 2019-12-31 DIAGNOSIS — Z01810 Encounter for preprocedural cardiovascular examination: Secondary | ICD-10-CM | POA: Diagnosis not present

## 2019-12-31 DIAGNOSIS — E878 Other disorders of electrolyte and fluid balance, not elsewhere classified: Secondary | ICD-10-CM | POA: Diagnosis present

## 2019-12-31 DIAGNOSIS — R531 Weakness: Secondary | ICD-10-CM | POA: Diagnosis present

## 2019-12-31 DIAGNOSIS — F17220 Nicotine dependence, chewing tobacco, uncomplicated: Secondary | ICD-10-CM | POA: Diagnosis present

## 2019-12-31 DIAGNOSIS — G8929 Other chronic pain: Secondary | ICD-10-CM | POA: Diagnosis present

## 2019-12-31 DIAGNOSIS — G9341 Metabolic encephalopathy: Secondary | ICD-10-CM | POA: Diagnosis not present

## 2019-12-31 DIAGNOSIS — Q398 Other congenital malformations of esophagus: Secondary | ICD-10-CM | POA: Diagnosis not present

## 2019-12-31 DIAGNOSIS — R1314 Dysphagia, pharyngoesophageal phase: Secondary | ICD-10-CM | POA: Diagnosis present

## 2019-12-31 DIAGNOSIS — R9431 Abnormal electrocardiogram [ECG] [EKG]: Secondary | ICD-10-CM | POA: Diagnosis not present

## 2019-12-31 DIAGNOSIS — I248 Other forms of acute ischemic heart disease: Secondary | ICD-10-CM | POA: Diagnosis present

## 2019-12-31 DIAGNOSIS — R778 Other specified abnormalities of plasma proteins: Secondary | ICD-10-CM | POA: Diagnosis not present

## 2019-12-31 DIAGNOSIS — R748 Abnormal levels of other serum enzymes: Secondary | ICD-10-CM

## 2019-12-31 DIAGNOSIS — F10231 Alcohol dependence with withdrawal delirium: Secondary | ICD-10-CM | POA: Diagnosis not present

## 2019-12-31 DIAGNOSIS — R7989 Other specified abnormal findings of blood chemistry: Secondary | ICD-10-CM | POA: Diagnosis present

## 2019-12-31 DIAGNOSIS — R634 Abnormal weight loss: Secondary | ICD-10-CM | POA: Diagnosis not present

## 2019-12-31 DIAGNOSIS — Z20822 Contact with and (suspected) exposure to covid-19: Secondary | ICD-10-CM | POA: Diagnosis present

## 2019-12-31 DIAGNOSIS — E46 Unspecified protein-calorie malnutrition: Secondary | ICD-10-CM | POA: Diagnosis present

## 2019-12-31 DIAGNOSIS — Z79899 Other long term (current) drug therapy: Secondary | ICD-10-CM | POA: Diagnosis not present

## 2019-12-31 LAB — COMPREHENSIVE METABOLIC PANEL
ALT: 16 U/L (ref 0–44)
AST: 50 U/L — ABNORMAL HIGH (ref 15–41)
Albumin: 2.9 g/dL — ABNORMAL LOW (ref 3.5–5.0)
Alkaline Phosphatase: 62 U/L (ref 38–126)
Anion gap: 16 — ABNORMAL HIGH (ref 5–15)
BUN: 15 mg/dL (ref 8–23)
CO2: 35 mmol/L — ABNORMAL HIGH (ref 22–32)
Calcium: 8.9 mg/dL (ref 8.9–10.3)
Chloride: 89 mmol/L — ABNORMAL LOW (ref 98–111)
Creatinine, Ser: 1.18 mg/dL — ABNORMAL HIGH (ref 0.44–1.00)
GFR calc Af Amer: 53 mL/min — ABNORMAL LOW (ref >60)
GFR calc non Af Amer: 45 mL/min — ABNORMAL LOW (ref >60)
Glucose, Bld: 142 mg/dL — ABNORMAL HIGH (ref 70–99)
Potassium: 2.2 mmol/L — CL (ref 3.5–5.1)
Sodium: 140 mmol/L (ref 135–145)
Total Bilirubin: 1.3 mg/dL — ABNORMAL HIGH (ref 0.3–1.2)
Total Protein: 6.4 g/dL — ABNORMAL LOW (ref 6.5–8.1)

## 2019-12-31 LAB — CBC
HCT: 36.2 % (ref 36.0–46.0)
Hemoglobin: 12.7 g/dL (ref 12.0–15.0)
MCH: 31.1 pg (ref 26.0–34.0)
MCHC: 35.1 g/dL (ref 30.0–36.0)
MCV: 88.5 fL (ref 80.0–100.0)
Platelets: 231 10*3/uL (ref 150–400)
RBC: 4.09 MIL/uL (ref 3.87–5.11)
RDW: 13.5 % (ref 11.5–15.5)
WBC: 7 10*3/uL (ref 4.0–10.5)
nRBC: 0 % (ref 0.0–0.2)

## 2019-12-31 LAB — BASIC METABOLIC PANEL
Anion gap: 14 (ref 5–15)
BUN: 19 mg/dL (ref 8–23)
CO2: 36 mmol/L — ABNORMAL HIGH (ref 22–32)
Calcium: 8.8 mg/dL — ABNORMAL LOW (ref 8.9–10.3)
Chloride: 87 mmol/L — ABNORMAL LOW (ref 98–111)
Creatinine, Ser: 1.21 mg/dL — ABNORMAL HIGH (ref 0.44–1.00)
GFR calc Af Amer: 51 mL/min — ABNORMAL LOW (ref >60)
GFR calc non Af Amer: 44 mL/min — ABNORMAL LOW (ref >60)
Glucose, Bld: 123 mg/dL — ABNORMAL HIGH (ref 70–99)
Potassium: 2.1 mmol/L — CL (ref 3.5–5.1)
Sodium: 137 mmol/L (ref 135–145)

## 2019-12-31 LAB — MAGNESIUM: Magnesium: 2 mg/dL (ref 1.7–2.4)

## 2019-12-31 LAB — PHOSPHORUS: Phosphorus: 1.9 mg/dL — ABNORMAL LOW (ref 2.5–4.6)

## 2019-12-31 LAB — SARS CORONAVIRUS 2 (TAT 6-24 HRS): SARS Coronavirus 2: NEGATIVE

## 2019-12-31 MED ORDER — POTASSIUM PHOSPHATES 15 MMOLE/5ML IV SOLN
20.0000 mmol | Freq: Once | INTRAVENOUS | Status: AC
  Administered 2019-12-31: 20 mmol via INTRAVENOUS
  Filled 2019-12-31: qty 6.67

## 2019-12-31 MED ORDER — POTASSIUM CHLORIDE 10 MEQ/100ML IV SOLN
10.0000 meq | INTRAVENOUS | Status: AC
  Administered 2019-12-31 (×2): 10 meq via INTRAVENOUS
  Filled 2019-12-31 (×2): qty 100

## 2019-12-31 MED ORDER — POTASSIUM CHLORIDE 10 MEQ/100ML IV SOLN
10.0000 meq | INTRAVENOUS | Status: AC
  Administered 2019-12-31 (×4): 10 meq via INTRAVENOUS
  Filled 2019-12-31 (×4): qty 100

## 2019-12-31 NOTE — Consult Note (Addendum)
PHARMACY CONSULT NOTE - FOLLOW UP  Pharmacy Consult for Electrolyte Monitoring and Replacement   Recent Labs: Potassium (mmol/L)  Date Value  12/31/2019 2.2 (LL)  08/01/2014 3.5   Magnesium (mg/dL)  Date Value  12/31/2019 2.0   Calcium (mg/dL)  Date Value  12/31/2019 8.9   Calcium, Total (mg/dL)  Date Value  08/01/2014 9.1   Albumin (g/dL)  Date Value  12/31/2019 2.9 (L)  07/31/2014 3.3 (L)   Phosphorus (mg/dL)  Date Value  12/31/2019 1.9 (L)   Sodium (mmol/L)  Date Value  12/31/2019 140  08/01/2014 138   Assessment: Pharmacy consulted for electrolyte monitoring and replacement for 74 yo female admitted with hypokalemia and hypomagnesemia. Potassium on admission <2.   Goal of Therapy:  Electrolytes WNL   Plan:  Patient received 6 runs of KCl 10 mEq plus KCl 60 mEq PO for a total of 120 mEq at time of review. Last KCl 10 mEq bag was given at 1813. CMP drawn at 1713. Pt is also on D5 LR W/20 mEq of KCl (started 6/28 PM) Will replace phosphorus and potassium with Kphos IV 20 mmol x 1 IV. Will follow up with AM labs. Set labs to be obtained at 0600 so patient can received full Kphos dose.   Pharmacy will continue follow and replace electrolytes as needed.    Oswald Hillock, PharmD, BCPS Clinical Pharmacist 12/31/2019 6:45 PM

## 2019-12-31 NOTE — Progress Notes (Signed)
PROGRESS NOTE    Katie Caldwell  ZDG:[id number] DOB: August 28, 1945 DOA: 12/30/2019 PCP: Rusty Aus, MD   Brief Narrative:  HPI On 12/30/2019 by Dr. Zada Finders Katie Caldwell is a 74 y.o. female with medical history significant for hypertension, hyperlipidemia, and chronic back pain with lumbar disc disease who presents to the ED for evaluation of abnormal labs.  Daughter is at bedside to provide additional history.  Patient was seen by her PCP earlier today on 12/30/2019 for evaluation of significant weight loss, shortness of breath, poor appetite.  Labs were obtained and she was found to have significant hypokalemia of 1.7, magnesium 1.4, and new AKI with creatinine up to 1.5.  She was called to present to the ED for further evaluation and management.  Her daughter came down from [place] to visit and noticed significant weight loss since the last time she saw her mother.  Patient has an apparent 30 pound weight loss over the last 2 months or so.  She says she has had no appetite and says she has lost her taste for foods.  She has had progressive generalized weakness and fatigue.  She becomes easily tired and short of breath with walking short distances.  She denies any abdominal pain, nausea, vomiting, diarrhea, chest pain, dyspnea, or dysuria.  She has not had any recent medication changes.  She says she has been trying to keep up with her hydration by drinking plenty of water. Assessment & Plan   Hypokalemia/hypomagnesemia/hypochloremia -Likely secondary to nutritional deficiency and poor intake -It is not clear why patient has had poor oral intake, she states that it was due to a stomach issue -Potassium on admission was less than 2.  Currently is 2.1 -Magnesium has been supplemented and currently within normal limits -Will continue to supplement potassium and IV fluids as well as several IV runs of potassium -Continue to monitor BMP and magnesium  Acute kidney  injury -Creatinine on admission was 1.61, currently down to 1.21 -Baseline creatinine approximately 0.7 (see this was 07/04/2019)-labs obtained from care everywhere -Continue IV fluid, monitor BMP  Elevated troponin -Suspect secondary to demand ischemia/hypokalemia -Patient currently not having any complaints of chest pain -Troponins are trending downward and not consistent with ACS  Essential hypertension -BP was soft on admission, amlodipine and lisinopril currently held  Chronic back pain -Continue Percocet (home medication)  Elevated anion gap -Was up to 21 on admission, however now closed -Secondary to electrolyte abnormalities  Malnutrition/poor oral intake -Patient states she has had weight loss over the last month due to some type of stomach issue (however's issue is no longer ongoing) -Will consult nutrition  ?  GERD -Patient tells me that whenever pain she was having was when food would hit her stomach. -She says this is no longer going on -Will discuss with gastroenterology  DVT Prophylaxis  heparin  Code Status: Full  Family Communication: None at bedside  Disposition Plan:  Status is: Observation  The patient will require care spanning > 2 midnights and should be moved to inpatient because: Persistent severe electrolyte disturbances  Dispo: The patient is from: Home              Anticipated d/c is to: Home              Anticipated d/c date is: 1 day              Patient currently is not medically stable to d/c.  Consultants None  Procedures  None   Antibiotics   Anti-infectives (From admission, onward)   None      Subjective:   Katie Caldwell seen and examined today.  Patient states that over the past month she was not feeling well but in the past week it was somewhat worse and she was unable to eat much.  She has had some weight loss.  She states whatever GI issues she was having, has now resolved.  She states that what ever she was eating  was causing pain as it moved to her stomach.  Currently she denies any chest pain or shortness of breath, abdominal pain, nausea or vomiting, diarrhea or constipation, dizziness or headache.  Objective:   Vitals:   12/31/19 0742 12/31/19 0802 12/31/19 0807 12/31/19 0922  BP:  128/71    Pulse: 69 74 60 67  Resp:  20 18 (!) 21  Temp:      TempSrc:      SpO2: 94% 100% 99% 93%  Weight:      Height:       No intake or output data in the 24 hours ending 12/31/19 0925 Filed Weights   12/30/19 1810  Weight: 67.6 kg    Exam  General: Well developed, well nourished, NAD, appears stated age  79: NCAT, mucous membranes moist.   Cardiovascular: S1 S2 auscultated, RRR  Respiratory: Clear to auscultation bilaterally w  Abdomen: Soft, nontender, nondistended, + bowel sounds  Extremities: warm dry without cyanosis clubbing or edema  Neuro: AAOx3, nonfocal  Psych: Appropriate mood and affect   Data Reviewed: I have personally reviewed following labs and imaging studies  CBC: Recent Labs  Lab 12/30/19 1830 12/31/19 0642  WBC 8.5 7.0  NEUTROABS 5.2  --   HGB 12.6 12.7  HCT 35.7* 36.2  MCV 88.1 88.5  PLT 240 850   Basic Metabolic Panel: Recent Labs  Lab 12/30/19 1830 12/31/19 0642  NA 135 137  K <2.0* 2.1*  CL 79* 87*  CO2 35* 36*  GLUCOSE 114* 123*  BUN 22 19  CREATININE 1.61* 1.21*  CALCIUM 8.5* 8.8*  MG 1.4* 2.0  PHOS  --  1.9*   GFR: Estimated Creatinine Clearance: 36.7 mL/min (A) (by C-G formula based on SCr of 1.21 mg/dL (H)). Liver Function Tests: No results for input(s): AST, ALT, ALKPHOS, BILITOT, PROT, ALBUMIN in the last 168 hours. No results for input(s): LIPASE, AMYLASE in the last 168 hours. No results for input(s): AMMONIA in the last 168 hours. Coagulation Profile: No results for input(s): INR, PROTIME in the last 168 hours. Cardiac Enzymes: No results for input(s): CKTOTAL, CKMB, CKMBINDEX, TROPONINI in the last 168 hours. BNP (last 3  results) No results for input(s): PROBNP in the last 8760 hours. HbA1C: No results for input(s): HGBA1C in the last 72 hours. CBG: No results for input(s): GLUCAP in the last 168 hours. Lipid Profile: No results for input(s): CHOL, HDL, LDLCALC, TRIG, CHOLHDL, LDLDIRECT in the last 72 hours. Thyroid Function Tests: No results for input(s): TSH, T4TOTAL, FREET4, T3FREE, THYROIDAB in the last 72 hours. Anemia Panel: No results for input(s): VITAMINB12, FOLATE, FERRITIN, TIBC, IRON, RETICCTPCT in the last 72 hours. Urine analysis:    Component Value Date/Time   COLORURINE Straw 07/31/2014 1107   APPEARANCEUR Clear 07/31/2014 1107   LABSPEC 1.006 07/31/2014 1107   PHURINE 6.0 07/31/2014 1107   GLUCOSEU Negative 07/31/2014 1107   HGBUR Negative 07/31/2014 1107   BILIRUBINUR Negative 07/31/2014 1107   KETONESUR Negative 07/31/2014 1107  PROTEINUR Negative 07/31/2014 1107   NITRITE Negative 07/31/2014 1107   LEUKOCYTESUR Negative 07/31/2014 1107   Sepsis Labs: @LABRCNTIP (procalcitonin:4,lacticidven:4)  )No results found for this or any previous visit (from the past 240 hour(s)).    Radiology Studies: No results found.   Scheduled Meds: . heparin  5,000 Units Subcutaneous Q8H  . oxyCODONE-acetaminophen  1 tablet Oral Daily  . rosuvastatin  10 mg Oral QHS  . sodium chloride flush  3 mL Intravenous Q12H  . vitamin B-12  1,000 mcg Oral Daily   Continuous Infusions: . dextrose 5% lactated ringers with KCl 20 mEq/L 100 mL/hr at 12/31/19 0009  . potassium chloride       LOS: 0 days   Time Spent in minutes   45 minutes  Akosua Constantine D.O. on 12/31/2019 at 9:25 AM  Between 7am to 7pm - Please see pager noted on [email or web address]  After 7pm go to [email or web address]  And look for the night coverage person covering for me after hours  [facility] Office  [phone]

## 2019-12-31 NOTE — ED Notes (Signed)
Pt found ambulating to the BR, IV pulled from her RFA, fluids dripping into the floor and pt had removed her CM .. IV is clean and dry with no redness. IV fluid restarted onto the LFA site. Pt informed to please use her call light when needing to ambulate to the BR, pt acknowledged . Will continue to monitor the pt.

## 2019-12-31 NOTE — Consult Note (Signed)
PHARMACY CONSULT NOTE - FOLLOW UP  Pharmacy Consult for Electrolyte Monitoring and Replacement   Recent Labs: Potassium (mmol/L)  Date Value  12/31/2019 2.1 (LL)  08/01/2014 3.5   Magnesium (mg/dL)  Date Value  12/31/2019 2.0   Calcium (mg/dL)  Date Value  12/31/2019 8.8 (L)   Calcium, Total (mg/dL)  Date Value  08/01/2014 9.1   Albumin (g/dL)  Date Value  07/31/2014 3.3 (L)   Phosphorus (mg/dL)  Date Value  12/31/2019 1.9 (L)   Sodium (mmol/L)  Date Value  12/31/2019 137  08/01/2014 138   Assessment: Pharmacy consulted for electrolyte monitoring and replacement for 74 yo female admitted with hypokalemia and hypomagnesemia. Potassium on admission <2.   Goal of Therapy:  Electrolytes WNL   Plan:  Patient currently ordered KCL 1mEq IV x 6 runs.   Will order K+ level @ 1800 this evening. If additional replacement is warranted Kphos IV can be considered.   Pharmacy will continue follow and replace electrolytes as needed.    Pernell Dupre, PharmD, BCPS Clinical Pharmacist 12/31/2019 1:08 PM

## 2019-12-31 NOTE — Plan of Care (Signed)
  Problem: Nutrition: Goal: Adequate nutrition will be maintained Outcome: Not Progressing Note: GI MD IN TO SEE PT   Problem: Elimination: Goal: Will not experience complications related to urinary retention Outcome: Progressing   Problem: Pain Managment: Goal: General experience of comfort will improve Outcome: Progressing   Problem: Safety: Goal: Ability to remain free from injury will improve Outcome: Progressing   Problem: Skin Integrity: Goal: Risk for impaired skin integrity will decrease Outcome: Progressing

## 2019-12-31 NOTE — ED Notes (Signed)
Pt given another drink. Denies pain. Denies any needs.

## 2019-12-31 NOTE — Consult Note (Addendum)
Katie Antigua, MD [address] [place], [place], [place], [address] [address], [place], [place], [place], [address] Phone: [phone]  Fax: [phone]  Consultation  Referring Provider:     Dr. Maye Hides Primary Care Physician:  Rusty Aus, MD Reason for Consultation:   Stomach pain, poor oral intake  Date of Admission:  12/30/2019 Date of Consultation:  12/31/2019         HPI:   Katie Caldwell is a 74 y.o. female admitted for hypokalemia, and reported significant weight loss, low appetite. Denies any abdominal pain, nausea/vomiting. Does report some intermittent dysphagia but is very vague about these symptoms. No prior EGD  Labs done yesterday at PCP office, available in care everywhere, showed elevated bilirubin at 1.8 which is new.  Normal alk phos and transaminases.  Elevated creatinine as well.  Lipase is elevated to 95  Last colonoscopy, 2017 by Dr. Gustavo Lah, with 1 mm colon polyp removed.  Diverticulosis reported.  Pathology showed hyperplastic polyp  Past Medical History:  Diagnosis Date  . Hypertension     Past Surgical History:  Procedure Laterality Date  . ABDOMINAL HYSTERECTOMY    . bladder surgery    . COLONOSCOPY WITH PROPOFOL N/A 02/01/2016   Procedure: COLONOSCOPY WITH PROPOFOL;  Surgeon: Lollie Sails, MD;  Location: [facility] ENDOSCOPY;  Service: Endoscopy;  Laterality: N/A;  . TONSILLECTOMY      Prior to Admission medications   Medication Sig Start Date End Date Taking? Authorizing Provider  amLODipine (NORVASC) 10 MG tablet Take 10 mg by mouth daily.   Yes [provider]  Cholecalciferol 2000 units TABS Take 2,000 Units by mouth daily.    Yes [provider]  Cyanocobalamin (VITAMIN B 12) 100 MCG LOZG Take 1,000 mcg by mouth daily.    Yes [provider]  lisinopril (ZESTRIL) 20 MG tablet Take 20 mg by mouth daily.   Yes [provider]  oxyCODONE-acetaminophen (PERCOCET/ROXICET) 5-325 MG tablet  Take 1 tablet by mouth daily.    Yes [provider]  rosuvastatin (CRESTOR) 10 MG tablet Take 10 mg by mouth at bedtime. 12/24/19  Yes [provider]    Family History  Problem Relation Age of Onset  . Breast cancer Sister 96     Social History   Tobacco Use  . Smoking status: Former Research scientist (life sciences)  . Smokeless tobacco: Current User  Substance Use Topics  . Alcohol use: Not Currently  . Drug use: Never    Allergies as of 12/30/2019 - Review Complete 12/30/2019  Allergen Reaction Noted  . Micardis [telmisartan]  01/29/2016    Review of Systems:    All systems reviewed and negative except where noted in HPI.   Physical Exam:  Vital signs in last 24 hours: Vitals:   12/31/19 1200 12/31/19 1231 12/31/19 1356 12/31/19 1500  BP: 129/71 126/86 129/72 130/62  Pulse: 69 77 64 62  Resp: 20 18 18 17   Temp:    97.9 F (36.6 C)  TempSrc:    Oral  SpO2: 95% 96% 97% 98%  Weight:      Height:       Last BM Date: 12/31/19 General:   Pleasant, cooperative in NAD Head:  Normocephalic and atraumatic. Eyes:   No icterus.   Conjunctiva pink. PERRLA. Ears:  Normal auditory acuity. Neck:  Supple; no masses or thyroidomegaly Lungs: Respirations even and unlabored. Lungs clear to auscultation bilaterally.   No wheezes, crackles, or rhonchi.  Abdomen:  Soft,  nondistended, nontender. Normal bowel sounds. No appreciable masses or hepatomegaly.  No rebound or guarding.  Neurologic:  Alert and oriented x3;  grossly normal neurologically. Skin:  Intact without significant lesions or rashes. Cervical Nodes:  No significant cervical adenopathy. Psych:  Alert and cooperative. Normal affect.  LAB RESULTS: Recent Labs    12/30/19 1830 12/31/19 0642  WBC 8.5 7.0  HGB 12.6 12.7  HCT 35.7* 36.2  PLT 240 231   BMET Recent Labs    12/30/19 1830 12/31/19 0642  NA 135 137  K <2.0* 2.1*  CL 79* 87*  CO2 35* 36*  GLUCOSE 114* 123*  BUN 22 19  CREATININE 1.61* 1.21*  CALCIUM  8.5* 8.8*   LFT No results for input(s): PROT, ALBUMIN, AST, ALT, ALKPHOS, BILITOT, BILIDIR, IBILI in the last 72 hours. PT/INR No results for input(s): LABPROT, INR in the last 72 hours.  STUDIES: No results found.    Impression / Plan:   [name] is a 74 y.o. y/o female with weight loss, hypokalemia, poor oral intake, elevated bilirubin on outside labs yesterday  Due to non specific symptoms with no abdominal pain please obtain further workup with repeat liver enzymes today and if bili stil abnormal obtain MRCP abdomen  Depending on above results EGD is a consideration due to poor oral intake, intermittent dysphagia and weight loss  However, pt will need cardiac cleraance and medical optimization and correction of electrolytes prior to endoscopy  Plan discussed with pt and daughter and they are agreeable    Thank you for involving me in the care of this patient.      LOS: 0 days   Virgel Manifold, MD  12/31/2019, 3:41 PM

## 2019-12-31 NOTE — ED Notes (Signed)
Pt ambulatory to bathroom and back to bed with this RN's assistance.

## 2019-12-31 NOTE — ED Notes (Signed)
Rosuvastatin not yet given as not yet received from pharm. Messaged pharm.

## 2019-12-31 NOTE — ED Notes (Signed)
Pt assisted to bedside commode

## 2019-12-31 NOTE — ED Notes (Signed)
Pt up to bathroom and back to bed with the assistance of this RN.

## 2019-12-31 NOTE — ED Notes (Signed)
This RN attempted to call report.  

## 2019-12-31 NOTE — ED Notes (Signed)
Fluids paused , bag complete, waiting for new bag from pharmacy.

## 2019-12-31 NOTE — ED Notes (Signed)
Pt unplugged to get up to bedside toilet.

## 2019-12-31 NOTE — ED Notes (Addendum)
See triage note. Pt stating she felt weak, dizzy and SOB upon initial arrival. Pt denies any complaints at this time. Pt VSS. Pt in NAD. NSR on monitor. Pt remains on 1.5L [place] for comfort. Pt family at bedside.

## 2020-01-01 ENCOUNTER — Inpatient Hospital Stay: Payer: Medicare PPO

## 2020-01-01 ENCOUNTER — Inpatient Hospital Stay (HOSPITAL_COMMUNITY)
Admit: 2020-01-01 | Discharge: 2020-01-01 | Disposition: A | Discharge status: Home / Self Care | Payer: Medicare PPO | Attending: Cardiology | Admitting: Cardiology

## 2020-01-01 ENCOUNTER — Inpatient Hospital Stay: Admit: 2020-01-01 | Payer: Medicare PPO

## 2020-01-01 DIAGNOSIS — R1310 Dysphagia, unspecified: Secondary | ICD-10-CM

## 2020-01-01 DIAGNOSIS — E876 Hypokalemia: Principal | ICD-10-CM

## 2020-01-01 DIAGNOSIS — Z01818 Encounter for other preprocedural examination: Secondary | ICD-10-CM

## 2020-01-01 DIAGNOSIS — R9431 Abnormal electrocardiogram [ECG] [EKG]: Secondary | ICD-10-CM

## 2020-01-01 DIAGNOSIS — Z01810 Encounter for preprocedural cardiovascular examination: Secondary | ICD-10-CM

## 2020-01-01 LAB — PHOSPHORUS: Phosphorus: 2.5 mg/dL (ref 2.5–4.6)

## 2020-01-01 LAB — HEPATIC FUNCTION PANEL
ALT: 15 U/L (ref 0–44)
AST: 45 U/L — ABNORMAL HIGH (ref 15–41)
Albumin: 2.8 g/dL — ABNORMAL LOW (ref 3.5–5.0)
Alkaline Phosphatase: 59 U/L (ref 38–126)
Bilirubin, Direct: 0.2 mg/dL (ref 0.0–0.2)
Indirect Bilirubin: 0.6 mg/dL (ref 0.3–0.9)
Total Bilirubin: 0.8 mg/dL (ref 0.3–1.2)
Total Protein: 6.2 g/dL — ABNORMAL LOW (ref 6.5–8.1)

## 2020-01-01 LAB — COMPREHENSIVE METABOLIC PANEL
ALT: 15 U/L (ref 0–44)
AST: 44 U/L — ABNORMAL HIGH (ref 15–41)
Albumin: 2.8 g/dL — ABNORMAL LOW (ref 3.5–5.0)
Alkaline Phosphatase: 59 U/L (ref 38–126)
Anion gap: 13 (ref 5–15)
BUN: 9 mg/dL (ref 8–23)
CO2: 37 mmol/L — ABNORMAL HIGH (ref 22–32)
Calcium: 8.8 mg/dL — ABNORMAL LOW (ref 8.9–10.3)
Chloride: 92 mmol/L — ABNORMAL LOW (ref 98–111)
Creatinine, Ser: 0.92 mg/dL (ref 0.44–1.00)
GFR calc Af Amer: >60 mL/min (ref >60)
GFR calc non Af Amer: >60 mL/min (ref >60)
Glucose, Bld: 112 mg/dL — ABNORMAL HIGH (ref 70–99)
Potassium: 2.8 mmol/L — ABNORMAL LOW (ref 3.5–5.1)
Sodium: 142 mmol/L (ref 135–145)
Total Bilirubin: 0.8 mg/dL (ref 0.3–1.2)
Total Protein: 6.2 g/dL — ABNORMAL LOW (ref 6.5–8.1)

## 2020-01-01 LAB — CBC
HCT: 35.9 % — ABNORMAL LOW (ref 36.0–46.0)
Hemoglobin: 12.4 g/dL (ref 12.0–15.0)
MCH: 31 pg (ref 26.0–34.0)
MCHC: 34.5 g/dL (ref 30.0–36.0)
MCV: 89.8 fL (ref 80.0–100.0)
Platelets: 221 10*3/uL (ref 150–400)
RBC: 4 MIL/uL (ref 3.87–5.11)
RDW: 13.5 % (ref 11.5–15.5)
WBC: 7 10*3/uL (ref 4.0–10.5)
nRBC: 0 % (ref 0.0–0.2)

## 2020-01-01 LAB — ECHOCARDIOGRAM COMPLETE
Height: 65 in
Weight: 2384 oz

## 2020-01-01 LAB — POTASSIUM: Potassium: 2.7 mmol/L — CL (ref 3.5–5.1)

## 2020-01-01 LAB — MAGNESIUM: Magnesium: 1.3 mg/dL — ABNORMAL LOW (ref 1.7–2.4)

## 2020-01-01 MED ORDER — POTASSIUM CHLORIDE 10 MEQ/100ML IV SOLN
10.0000 meq | INTRAVENOUS | Status: AC
  Administered 2020-01-02 (×6): 10 meq via INTRAVENOUS
  Filled 2020-01-01 (×3): qty 100

## 2020-01-01 MED ORDER — POTASSIUM CHLORIDE 10 MEQ/100ML IV SOLN
10.0000 meq | INTRAVENOUS | Status: AC
  Administered 2020-01-01 (×6): 10 meq via INTRAVENOUS
  Filled 2020-01-01 (×2): qty 100

## 2020-01-01 MED ORDER — MAGNESIUM SULFATE 4 GM/100ML IV SOLN
4.0000 g | Freq: Once | INTRAVENOUS | Status: AC
  Administered 2020-01-01: 4 g via INTRAVENOUS
  Filled 2020-01-01: qty 100

## 2020-01-01 MED ORDER — LORAZEPAM 2 MG/ML IJ SOLN
1.0000 mg | Freq: Once | INTRAMUSCULAR | Status: AC
  Administered 2020-01-01: 03:00:00 1 mg via INTRAVENOUS
  Filled 2020-01-01: qty 1

## 2020-01-01 MED ORDER — QUETIAPINE FUMARATE 25 MG PO TABS
25.0000 mg | ORAL_TABLET | Freq: Once | ORAL | Status: AC
  Administered 2020-01-01: 25 mg via ORAL
  Filled 2020-01-01: qty 1

## 2020-01-01 MED ORDER — AMLODIPINE BESYLATE 5 MG PO TABS
5.0000 mg | ORAL_TABLET | Freq: Every day | ORAL | Status: DC
  Administered 2020-01-01 – 2020-01-03 (×3): 5 mg via ORAL
  Filled 2020-01-01 (×3): qty 1

## 2020-01-01 MED ORDER — ENOXAPARIN SODIUM 40 MG/0.4ML ~~LOC~~ SOLN
40.0000 mg | SUBCUTANEOUS | Status: DC
  Administered 2020-01-01 – 2020-01-02 (×2): 40 mg via SUBCUTANEOUS
  Filled 2020-01-01 (×2): qty 0.4

## 2020-01-01 NOTE — Consult Note (Signed)
PHARMACY CONSULT NOTE - FOLLOW UP  Pharmacy Consult for Electrolyte Monitoring and Replacement   Recent Labs: Potassium (mmol/L)  Date Value  01/01/2020 2.8 (L)  08/01/2014 3.5   Magnesium (mg/dL)  Date Value  01/01/2020 1.3 (L)   Calcium (mg/dL)  Date Value  01/01/2020 8.8 (L)   Calcium, Total (mg/dL)  Date Value  08/01/2014 9.1   Albumin (g/dL)  Date Value  01/01/2020 2.8 (L)  07/31/2014 3.3 (L)   Phosphorus (mg/dL)  Date Value  01/01/2020 2.5   Sodium (mmol/L)  Date Value  01/01/2020 142  08/01/2014 138   Assessment: Pharmacy consulted for electrolyte monitoring and replacement for 74 yo female admitted with hypokalemia and hypomagnesemia. Potassium on admission <2.   Goal of Therapy:  Electrolytes WNL   Plan:  6/30 Will order KCL 14mEq IV x 6 and Magnesium 4gm IV x 1 dose.   Will order F/U K+ level from 2200 this evening.   Pharmacy will continue follow and replace electrolytes as needed.    Pernell Dupre, PharmD, BCPS Clinical Pharmacist 01/01/2020 11:56 AM

## 2020-01-01 NOTE — Plan of Care (Addendum)
Pt has become increasingly confused and impulsive this shift.  Keeps getting up to go home.  Doesn't know where she is or that it's the middle of the night. Called daughter, Gatha Mayer at 0100.  Gave onetime dose of 1 mg ativan. Ativan has had no effect.

## 2020-01-01 NOTE — Progress Notes (Signed)
*  PRELIMINARY RESULTS* Echocardiogram 2D Echocardiogram has been performed.  Sherrie Sport 01/01/2020, 3:52 PM

## 2020-01-01 NOTE — Progress Notes (Signed)
Initial Nutrition Assessment  DOCUMENTATION CODES:   Not applicable  INTERVENTION:  Monitor for diet advancement per GI recommendations Will provide nutrition supplements as appropriate Continue to monitor refeeding labs  NUTRITION DIAGNOSIS:   Inadequate oral intake related to poor appetite as evidenced by per patient/family report, percent weight loss (as reported in chart).  GOAL:   Patient will meet greater than or equal to 90% of their needs   MONITOR:   Weight trends, Diet advancement, Labs  REASON FOR ASSESSMENT:   Malnutrition Screening Tool, Consult Assessment of nutrition requirement/status  ASSESSMENT:  RD working remotely.  74 year old female with past medical history of HTN, HLD, and chronic back pain with lumbar disease admitted for hypokalemia after presenting with complaints of no appetite, significant wt loss as well as progressive generalized weakness and fatigue over the past couple of months.  RD attempted to reach pt via phone, however pt did not answer this afternoon, unable to obtain nutrition history at this time. Per flowsheets, pt consumed 100% x 1 documented meal on 6/29. Per chart review, pt reports no appetite, stated she has lost her taste for foods. Daughter who lives in [place] came to visit patient, noticed significant weight loss (~30lbs) over the last 2 months. Limited weight history for review. On admission pt weighed 148.72 lb. Per care everywhere on 07/18/19 she weighed 77.7 kg (170.94 lb), on 01/08/19 pt weighed 82.6 kg (181.72 lb). This indicates a 22 lb (13%) wt loss in 6 months which is significant. Given weight trends as well as reported poor appetite and intake, highly suspect malnutrition however unable to identify at this time. Patient is NPO at this time, will monitor for diet advancement per GI recommendations and provide nutrition supplements as appropriate.   Per notes: -pt with continued agitation/restless; sitter order for  safety -EGD pending MRCP results -cardiology pre-op eval with no additional testing needs -replete K to normal levels prior to EGD -Electrolyte monitoring/managment per pharmacy  Medications reviewed and include: B12, oxycodone Labs: K 2.8 (L), Mg 1.3 (L) P 2.5 (WNL)  NUTRITION - FOCUSED PHYSICAL EXAM: Unable to complete at this time, RD working remotely.  Diet Order:   Diet Order            Diet NPO time specified Except for: Sips with Meds  Diet effective midnight                 EDUCATION NEEDS:   No education needs have been identified at this time  Skin:  Skin Assessment: Reviewed RN Assessment  Last BM:  6/30 type 5  Height:   Ht Readings from Last 1 Encounters:  12/30/19 5\' 5"  (1.651 m)    Weight:   Wt Readings from Last 1 Encounters:  12/30/19 67.6 kg    Ideal Body Weight:  56.8 kg  BMI:  Body mass index is 24.79 kg/m.  Estimated Nutritional Needs:   Kcal:  1800-2000  Protein:  90-100  Fluid:  >/= 1.7 L/day   Lajuan Lines, RD, LDN Clinical Nutrition After Hours/Weekend Pager # in [place]

## 2020-01-01 NOTE — Hospital Course (Addendum)
HPI and Hospital Course Summarized: Katie Caldwell is a 74 y.o. 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  Labs showed significant hypokalemia of 1.7, hypomagnesemia of 1.4, and new AKI with creatinine up to 1.5, prompting referral to the ED.   Daughter provided additional history upon admission.  She had come to visit from [place] and noticed significant weight loss since the last time she saw her mother.  Patient had reported to daughter that she has had no appetite, lost her taste for foods, progressive generalized weakness and fatigue, and becomes easily tired and short of breath with walking short distances.  No abdominal pain, nausea, vomiting, diarrhea or other associated complaints.  Admitted to hospitalist service.  GI is consulted.

## 2020-01-01 NOTE — Progress Notes (Signed)
[name], MD [address] [place], [place], [place], [address] [address], [place], [place], [place], [address] Phone: [phone]  Fax: [phone]   Subjective:  Patient denies any abdominal pain, nausea or vomiting  Objective: Exam: Vital signs in last 24 hours: Vitals:   12/31/19 2314 01/01/20 0259 01/01/20 0847 01/01/20 1213  BP: 136/76 (!) 143/74 (!) 144/62 (!) 144/82  Pulse: 72 73 70 67  Resp: 16 16 17 16   Temp: 97.6 F (36.4 C) 98.4 F (36.9 C) 98.5 F (36.9 C) 98 F (36.7 C)  TempSrc: Oral Oral Oral Oral  SpO2: 98% 96% 99% 95%  Weight:      Height:       Weight change:   Intake/Output Summary (Last 24 hours) at 01/01/2020 1533 Last data filed at 01/01/2020 0300 Gross per 24 hour  Intake 2118.62 ml  Output --  Net 2118.62 ml    General: No acute distress, AAO x3 Abd: Soft, NT/ND, No HSM Skin: Warm, no rashes Neck: Supple, Trachea midline   Lab Results: Lab Results  Component Value Date   WBC 7.0 01/01/2020   HGB 12.4 01/01/2020   HCT 35.9 (L) 01/01/2020   MCV 89.8 01/01/2020   PLT 221 01/01/2020   Micro Results: Recent Results (from the past 240 hour(s))  SARS CORONAVIRUS 2 (TAT 6-24 HRS) Nasopharyngeal Nasopharyngeal Swab     Status: None   Collection Time: 12/30/19  7:31 PM   Specimen: Nasopharyngeal Swab  Result Value Ref Range Status   SARS Coronavirus 2 NEGATIVE NEGATIVE Final    Comment: (NOTE) SARS-CoV-2 target nucleic acids are NOT DETECTED.  The SARS-CoV-2 RNA is generally detectable in upper and lower respiratory specimens during the acute phase of infection. Negative results do not preclude SARS-CoV-2 infection, do not rule out co-infections with other pathogens, and should not be used as the sole basis for treatment or other patient management decisions. Negative results must be combined with clinical observations, patient history, and epidemiological information. The expected result is Negative.  Fact  Sheet for Patients: SugarRoll.be  Fact Sheet for Healthcare Providers: https://www.woods-mathews.com/  This test is not yet approved or cleared by the [place] FDA and  has been authorized for detection and/or diagnosis of SARS-CoV-2 by FDA under an Emergency Use Authorization (EUA). This EUA will remain  in effect (meaning this test can be used) for the duration of the COVID-19 declaration under Se ction 564(b)(1) of the Act, 21 U.S.C. section 360bbb-3(b)(1), unless the authorization is terminated or revoked sooner.  Performed at [facility], [place] [address], [place], [place] [address]    Studies/Results: MR ABDOMEN MRCP WO CONTRAST  Result Date: 01/01/2020 CLINICAL DATA:  Hypokalemia, low appetite, weight loss. EXAM: MRI ABDOMEN WITHOUT CONTRAST  (INCLUDING MRCP) TECHNIQUE: Multiplanar multisequence MR imaging of the abdomen was performed. Heavily T2-weighted images of the biliary and pancreatic ducts were attempted, but the patient terminated the exam prior to acquisition. COMPARISON:  CT abdomen from 02/21/2013 FINDINGS: Despite efforts by the technologist and patient, motion artifact is present on today's exam and could not be eliminated. This reduces exam sensitivity and specificity. Lower chest: Unremarkable Hepatobiliary: Mild hepatic steatosis. Mild gallbladder wall thickening which may be related to the patient's hypoproteinemia/hypo but could also be seen in the setting inflammation. Probable sludge in the gallbladder. The common hepatic duct measures up to 0.9 cm, mildly prominent, with the common bile duct measuring up to 0.6 cm which is within normal limits. On image  19/3 there is a suggestion of abrupt truncation of the distal CBD which is less appreciable on the axial images this is of uncertain significance but makes it difficult to totally exclude the possibility of a small distal CBD stone. I not see a mass in the head of the  pancreas or ampulla, with the understanding that today's exam was performed without IV contrast which limits diagnostic sensitivity and specificity. Pancreas:  Unremarkable Spleen: Low signal intensity in the spleen on diffusion-weighted images. This is nonspecific although can have an association with multiple myeloma. Adrenals/Urinary Tract: Fluid signal intensity 1.7 cm lesion of the right kidney upper pole favoring benign cyst. Adrenal glands unremarkable. Stomach/Bowel: Unremarkable Vascular/Lymphatic: No pathologic adenopathy. Abdominal aortic atherosclerotic vascular disease. Other:  No supplemental non-categorized findings. Musculoskeletal: Chronic Schmorl's node along the superior endplate of S1. Multilevel lumbar and thoracic degenerative endplate findings. IMPRESSION: 1. Despite efforts by the technologist and patient, motion artifact is present on today's exam and could not be eliminated. This reduces exam sensitivity and specificity. 2. Mild hepatic steatosis. 3. Probable sludge in the gallbladder. Mild gallbladder wall thickening which may be related to the patient's hypoproteinemia/hypoproteinemia but could also be seen in the setting of inflammation. 4. The common hepatic duct measures up to 0.9 cm which is within normal limits. There is a suggestion of abrupt truncation of the distal CBD which is less appreciable on the axial images. This is of uncertain significance but makes it difficult to totally exclude the possibility of a small distal CBD stone. The patient terminated the exam prior to obtaining formal MRCP images. 5. Low signal intensity in the spleen on diffusion-weighted images. This is nonspecific although can have an association with multiple myeloma. Electronically Signed   By: Van Clines M.D.   On: 01/01/2020 07:50   MR 3D Recon At Scanner  Result Date: 01/01/2020 CLINICAL DATA:  Hypokalemia, low appetite, weight loss. EXAM: MRI ABDOMEN WITHOUT CONTRAST  (INCLUDING MRCP)  TECHNIQUE: Multiplanar multisequence MR imaging of the abdomen was performed. Heavily T2-weighted images of the biliary and pancreatic ducts were attempted, but the patient terminated the exam prior to acquisition. COMPARISON:  CT abdomen from 02/21/2013 FINDINGS: Despite efforts by the technologist and patient, motion artifact is present on today's exam and could not be eliminated. This reduces exam sensitivity and specificity. Lower chest: Unremarkable Hepatobiliary: Mild hepatic steatosis. Mild gallbladder wall thickening which may be related to the patient's hypoproteinemia/hypo but could also be seen in the setting inflammation. Probable sludge in the gallbladder. The common hepatic duct measures up to 0.9 cm, mildly prominent, with the common bile duct measuring up to 0.6 cm which is within normal limits. On image 19/3 there is a suggestion of abrupt truncation of the distal CBD which is less appreciable on the axial images this is of uncertain significance but makes it difficult to totally exclude the possibility of a small distal CBD stone. I not see a mass in the head of the pancreas or ampulla, with the understanding that today's exam was performed without IV contrast which limits diagnostic sensitivity and specificity. Pancreas:  Unremarkable Spleen: Low signal intensity in the spleen on diffusion-weighted images. This is nonspecific although can have an association with multiple myeloma. Adrenals/Urinary Tract: Fluid signal intensity 1.7 cm lesion of the right kidney upper pole favoring benign cyst. Adrenal glands unremarkable. Stomach/Bowel: Unremarkable Vascular/Lymphatic: No pathologic adenopathy. Abdominal aortic atherosclerotic vascular disease. Other:  No supplemental non-categorized findings. Musculoskeletal: Chronic Schmorl's node along the superior endplate of S1.  Multilevel lumbar and thoracic degenerative endplate findings. IMPRESSION: 1. Despite efforts by the technologist and patient, motion  artifact is present on today's exam and could not be eliminated. This reduces exam sensitivity and specificity. 2. Mild hepatic steatosis. 3. Probable sludge in the gallbladder. Mild gallbladder wall thickening which may be related to the patient's hypoproteinemia/hypoproteinemia but could also be seen in the setting of inflammation. 4. The common hepatic duct measures up to 0.9 cm which is within normal limits. There is a suggestion of abrupt truncation of the distal CBD which is less appreciable on the axial images. This is of uncertain significance but makes it difficult to totally exclude the possibility of a small distal CBD stone. The patient terminated the exam prior to obtaining formal MRCP images. 5. Low signal intensity in the spleen on diffusion-weighted images. This is nonspecific although can have an association with multiple myeloma. Electronically Signed   By: Van Clines M.D.   On: 01/01/2020 07:50   Medications:  Scheduled Meds: . amLODipine  5 mg Oral Daily  . enoxaparin (LOVENOX) injection  40 mg Subcutaneous Q24H  . oxyCODONE-acetaminophen  1 tablet Oral Daily  . rosuvastatin  10 mg Oral QHS  . sodium chloride flush  3 mL Intravenous Q12H  . vitamin B-12  1,000 mcg Oral Daily   Continuous Infusions: . dextrose 5% lactated ringers with KCl 20 mEq/L 100 mL/hr at 01/01/20 1005  . potassium chloride 10 mEq (01/01/20 1429)   PRN Meds:.acetaminophen **OR** acetaminophen, ondansetron **OR** ondansetron (ZOFRAN) IV   Assessment: Principal Problem:   Hypokalemia Active Problems:   Hypomagnesemia   AKI (acute kidney injury) ([place])   Elevated troponin   Hypertension   Pre-op evaluation    Plan: Patient had refused labs this morning  By the time labs were done, patient was found to be hypokalemic and per pharmacy, this was going to take hours to replace.  Anesthesia cannot proceed with EGD at this time due to hypokalemia as this needs to be corrected to a level above  3  Therefore, EGD for dysphagia has been postponed until tomorrow, pending potassium correction  Bilirubin and alk phos are normal.  Patient does not have any abdominal pain.  Therefore, patient does not have any clinical symptoms of biliary obstruction and ERCP is not indicated  Patient did not cooperate with MRCP images so this was not able to be obtained.  As per the report, MRI exam was limited by motion artifact but reports hepatic steatosis, sludge in the gallbladder, and suggestion of abrupt truncation of distal CBD.  In the absence of abdominal pain, and normal bilirubin and alk phos, this does not need urgent evaluation.  Patient may benefit from EUS as an outpatient to evaluate "abrupt truncation of the distal CBD"  Patient should follow-up in GI clinic after discharge to schedule the same.  This is not available at [facility] as an inpatient as none of our providers performed this procedure.  No indication for transfer for urgent EUS at this time  Would recommend surgery consult given gallbladder thickening, accompanied with weight loss    LOS: 1 day   [name], MD 01/01/2020, 3:33 PM

## 2020-01-01 NOTE — TOC Initial Note (Signed)
Transition of Care [facility]) - Initial/Assessment Note    Patient Details  Name: Katie Caldwell MRN: [id number] Date of Birth: [birth year]  Transition of [facility]) CM/SW Contact:    Shelbie Hutching, RN Phone Number: 01/01/2020, 10:29 AM  Clinical Narrative:                 Patient admitted to the hospital with hypokalemia.  RNCM introduced self to patient and daughter, Gatha Mayer, who is at the bedside and explained role.  Patient has been confused throughout the night and is now resting with her eyes closed.  Gatha Mayer reports that the patient lives with her husband Edison Pace.  Edison Pace is not in good health and the patient is usually his caregiver.  Daughter is down visiting from [place] and has noticed that the patient has lost quite a bit of weight and seems to be declining in health.   Daughter would like for the patient to have home health services at discharge, no agency preference.  Referral for home health services accepted by [place] [facility] [place] for RN, PT, OT, and aide. Patient does not need any equipment at this time.  Patient is current with her PCP and is able to drive but her husband does most of the driving.   RNCM will cont to follow and assist with discharge planning.    Expected Discharge Plan: [place] Barriers to Discharge: Continued Medical Work up   Patient Goals and CMS Choice Patient states their goals for this hospitalization and ongoing recovery are:: Daughter wound like some help for her mother at home- home health services and info on [facility] CMS Medicare.gov Compare Post Acute Care list provided to:: Patient Represenative (must comment) Choice offered to / list presented to : Adult Children Vernelle Emerald)  Expected Discharge Plan and Services Expected Discharge Plan: [place]   Discharge Planning Services: CM Consult Post Acute Care Choice: [place] arrangements for the past 2 months: Single Family Home                            HH Arranged: RN, PT, OT, Nurse's Aide HH Agency: [place] Date [facility] Agency Contacted: 01/01/20 Time [place]: 1027 Representative spoke with at [place]: [place] Arrangements/Services Living arrangements for the past 2 months: [place] with:: Spouse Patient language and need for interpreter reviewed:: Yes Do you feel safe going back to the place where you live?: Yes      Need for Family Participation in Patient Care: Yes (Comment) (recent decline in health) Care giver support system in place?: Yes (comment) (daughter and husband) Current home services: DME (walker) Criminal Activity/Legal Involvement Pertinent to Current Situation/Hospitalization: No - Comment as needed  Activities of Daily Living Home Assistive Devices/Equipment: None ADL Screening (condition at time of admission) Patient's cognitive ability adequate to safely complete daily activities?: Yes Is the patient deaf or have difficulty hearing?: No Does the patient have difficulty seeing, even when wearing glasses/contacts?: No Does the patient have difficulty concentrating, remembering, or making decisions?: No Patient able to express need for assistance with ADLs?: Yes Does the patient have difficulty dressing or bathing?: No Independently performs ADLs?: Yes (appropriate for developmental age) Does the patient have difficulty walking or climbing stairs?: No Weakness of Legs: None Weakness of Arms/Hands: None  Permission Sought/Granted Permission sought to share information with : Case Manager, Family Supports,  Other (comment) Permission granted to share information with : Yes, Verbal Permission Granted  Share Information with NAME: Vernelle Emerald  Permission granted to share info w AGENCY: [facility]  Permission granted to share info w Relationship: daughter     Emotional Assessment Appearance:: Appears stated age Attitude/Demeanor/Rapport: Unable to Assess    Orientation: : Oriented to Self Alcohol / Substance Use: Not Applicable Psych Involvement: No (comment)  Admission diagnosis:  Hypokalemia [E87.6] Hypomagnesemia [E83.42] AKI (acute kidney injury) ([place]) [N17.9] Patient Active Problem List   Diagnosis Date Noted  . Hypokalemia 12/30/2019  . Hypomagnesemia 12/30/2019  . AKI (acute kidney injury) ([place]) 12/30/2019  . Elevated troponin 12/30/2019  . Hypertension    PCP:  Rusty Aus, MD Pharmacy:   [id number] [place], [place] [address] [place] [address] Phone: [phone] Fax: [phone]     Social Determinants of Health (SDOH) Interventions    Readmission Risk Interventions No flowsheet data found.

## 2020-01-01 NOTE — Plan of Care (Signed)
Pt has become more uncooperative.  Gave seroquel and pt continues to be agitated and restless.  Sitter order for safety placed.

## 2020-01-01 NOTE — Consult Note (Signed)
Cardiology Consultation:   Patient ID: Katie Caldwell; [id number]; 1945-12-28   Admit date: 12/30/2019 Date of Consult: 01/01/2020  Primary Care Provider: Rusty Aus, MD Primary Cardiologist: New to [place] - consult by [place] Primary Electrophysiologist:  None   Patient Profile:   Katie Caldwell is a 74 y.o. female with a hx of HTN, HLD, and chronic back pain who is being seen today for preprocedure evaluation of EGD at the request of Dr. Ree Kida.  History of Present Illness:   [name] no previously known cardiac history. She was admitted to [facility] on 6/28 after being seen at her PCP's office for weight loss, poor appetite and SOB and was found to be hypokalemic at 1.7 and hypomagnesemic at 1.4 along with AKI of 1.5. The patient's daughter had come down from out of state and noted the patient had a significant weight loss of ~ 30 pounds over the prior couple of months. The patient had lost her taste for foods and was with generalized fatigue/malaise. She was hypotensive with BP in the 39Q systolic, which has subsequently improved to the 300P systolic following hydration. Repeat labs showed a potassium < 2, magnesium 1.4, BUN 22, SCr 1.61, HGB 12.6, HS-Tn 70-->64. She has received multiple runs of IV potassium along with oral KCl with potassium of 2.2 as of 6/29. Potassium this morning is pending. Her magnesium has been repleted to goal 2.0 as of 6/29. GI was consulted with plans for EGD for further evaluation. Cardiology is asked to evaluate preprocedure. Never with chest pain. Patient combative this morning, not following requests. Labs unable to be obtained secondary to patient refusal. Daughter on her way in. Baseline functional status unable to be obtained at this time.     Past Medical History:  Diagnosis Date   Hypertension     Past Surgical History:  Procedure Laterality Date   ABDOMINAL HYSTERECTOMY     bladder surgery     COLONOSCOPY WITH PROPOFOL  N/A 02/01/2016   Procedure: COLONOSCOPY WITH PROPOFOL;  Surgeon: Lollie Sails, MD;  Location: [facility] ENDOSCOPY;  Service: Endoscopy;  Laterality: N/A;   TONSILLECTOMY       Home Meds: Prior to Admission medications   Medication Sig Start Date End Date Taking? Authorizing Provider  amLODipine (NORVASC) 10 MG tablet Take 10 mg by mouth daily.   Yes [provider]  Cholecalciferol 2000 units TABS Take 2,000 Units by mouth daily.    Yes [provider]  Cyanocobalamin (VITAMIN B 12) 100 MCG LOZG Take 1,000 mcg by mouth daily.    Yes [provider]  lisinopril (ZESTRIL) 20 MG tablet Take 20 mg by mouth daily.   Yes [provider]  oxyCODONE-acetaminophen (PERCOCET/ROXICET) 5-325 MG tablet Take 1 tablet by mouth daily.    Yes [provider]  rosuvastatin (CRESTOR) 10 MG tablet Take 10 mg by mouth at bedtime. 12/24/19  Yes [provider]    Inpatient Medications: Scheduled Meds:  amLODipine  5 mg Oral Daily   heparin  5,000 Units Subcutaneous Q8H   oxyCODONE-acetaminophen  1 tablet Oral Daily   rosuvastatin  10 mg Oral QHS   sodium chloride flush  3 mL Intravenous Q12H   vitamin B-12  1,000 mcg Oral Daily   Continuous Infusions:  dextrose 5% lactated ringers with KCl 20 mEq/L 100 mL/hr at 01/01/20 0300   PRN Meds: acetaminophen **OR** acetaminophen, ondansetron **OR** ondansetron (ZOFRAN) IV  Allergies:   Allergies  Allergen  Reactions   Micardis [Telmisartan]     Social History:   Social History   Socioeconomic History   Marital status: Married    Spouse name: Not on file   Number of children: Not on file   Years of education: Not on file   Highest education level: Not on file  Occupational History   Not on file  Tobacco Use   Smoking status: Former Smoker   Smokeless tobacco: Current User  Substance and Sexual Activity   Alcohol use: Not Currently   Drug use: Never   Sexual activity: Not  Currently  Other Topics Concern   Not on file  Social History Narrative   Not on file   Social Determinants of Health   Financial Resource Strain:    Difficulty of Paying Living Expenses:   Food Insecurity:    Worried About Charity fundraiser in the Last Year:    Arboriculturist in the Last Year:   Transportation Needs:    Film/video editor (Medical):    Lack of Transportation (Non-Medical):   Physical Activity:    Days of Exercise per Week:    Minutes of Exercise per Session:   Stress:    Feeling of Stress :   Social Connections:    Frequency of Communication with Friends and Family:    Frequency of Social Gatherings with Friends and Family:    Attends Religious Services:    Active Member of Clubs or Organizations:    Attends Music therapist:    Marital Status:   Intimate Partner Violence:    Fear of Current or Ex-Partner:    Emotionally Abused:    Physically Abused:    Sexually Abused:      Family History:   Family History  Problem Relation Age of Onset   Breast cancer Sister 54    ROS:  Review of Systems  Unable to perform ROS: Other    Patient refusal to communicate/cooperate   Physical Exam/Data:   Vitals:   12/31/19 1500 12/31/19 1942 12/31/19 2314 01/01/20 0259  BP: 130/62 111/65 136/76 (!) 143/74  Pulse: 62 74 72 73  Resp: _0 Temp: 97.9 F (36.6 C) 97.8 F (36.6 C) 97.6 F (36.4 C) 98.4 F (36.9 C)  TempSrc: Oral  Oral Oral  SpO2: 98% 95% 98% 96%  Weight:      Height:        Intake/Output Summary (Last 24 hours) at 01/01/2020 7106 Last data filed at 01/01/2020 0300 Gross per 24 hour  Intake 2118.62 ml  Output 500 ml  Net 1618.62 ml   Filed Weights   12/30/19 1810  Weight: 67.6 kg   Body mass index is 24.79 kg/m.   Physical Exam: General: Well developed, well nourished, in no acute distress. Head: Normocephalic, atraumatic, sclera non-icteric, no xanthomas, nares without  discharge.  Neck: Negative for carotid bruits. JVD not elevated. Lungs: Clear bilaterally to auscultation without wheezes, rales, or rhonchi. Breathing is unlabored. Heart: RRR with S1 S2. No murmurs, rubs, or gallops appreciated. Abdomen: Soft, non-tender, non-distended with normoactive bowel sounds. No hepatomegaly. No rebound/guarding. No obvious abdominal masses. Msk:  Strength and tone appear normal for age. Extremities: No clubbing or cyanosis. No edema. Distal pedal pulses are 2+ and equal bilaterally. Neuro: Alert and oriented X 3. No facial asymmetry. No focal deficit. Moves all extremities spontaneously. Psych:  Responds to questions appropriately with a normal affect.   EKG:  The EKG  was personally reviewed and demonstrates: sinus bradycardia, 58 bpm, no acute st/t changes, underlying artifact  Telemetry:  Telemetry was personally reviewed and demonstrates: SR with occasional PVCs in a pattern of trigeminy   Weights: Filed Weights   12/30/19 1810  Weight: 67.6 kg    Relevant CV Studies: None available for review  Laboratory Data:  Chemistry Recent Labs  Lab 12/30/19 1830 12/31/19 0642 12/31/19 1713  NA 135 137 140  K <2.0* 2.1* 2.2*  CL 79* 87* 89*  CO2 35* 36* 35*  GLUCOSE 114* 123* 142*  BUN _0 CREATININE 1.61* 1.21* 1.18*  CALCIUM 8.5* 8.8* 8.9  GFRNONAA 31* 44* 45*  GFRAA 36* 51* 53*  ANIONGAP 21* 14 16*    Recent Labs  Lab 12/31/19 1713  PROT 6.4*  ALBUMIN 2.9*  AST 50*  ALT 16  ALKPHOS 62  BILITOT 1.3*   Hematology Recent Labs  Lab 12/30/19 1830 12/31/19 0642  WBC 8.5 7.0  RBC 4.05 4.09  HGB 12.6 12.7  HCT 35.7* 36.2  MCV 88.1 88.5  MCH 31.1 31.1  MCHC 35.3 35.1  RDW 13.5 13.5  PLT 240 231   Cardiac EnzymesNo results for input(s): TROPONINI in the last 168 hours. No results for input(s): TROPIPOC in the last 168 hours.  BNPNo results for input(s): BNP, PROBNP in the last 168 hours.  DDimer No results for input(s): DDIMER in  the last 168 hours.  Radiology/Studies:  MR ABDOMEN MRCP WO CONTRAST  Result Date: 01/01/2020 IMPRESSION: 1. Despite efforts by the technologist and patient, motion artifact is present on today's exam and could not be eliminated. This reduces exam sensitivity and specificity. 2. Mild hepatic steatosis. 3. Probable sludge in the gallbladder. Mild gallbladder wall thickening which may be related to the patient's hypoproteinemia/hypoproteinemia but could also be seen in the setting of inflammation. 4. The common hepatic duct measures up to 0.9 cm which is within normal limits. There is a suggestion of abrupt truncation of the distal CBD which is less appreciable on the axial images. This is of uncertain significance but makes it difficult to totally exclude the possibility of a small distal CBD stone. The patient terminated the exam prior to obtaining formal MRCP images. 5. Low signal intensity in the spleen on diffusion-weighted images. This is nonspecific although can have an association with multiple myeloma. Electronically Signed   By: Van Clines M.D.   On: 01/01/2020 07:50   MR 3D Recon At Scanner  Result Date: 01/01/2020 IMPRESSION: 1. Despite efforts by the technologist and patient, motion artifact is present on today's exam and could not be eliminated. This reduces exam sensitivity and specificity. 2. Mild hepatic steatosis. 3. Probable sludge in the gallbladder. Mild gallbladder wall thickening which may be related to the patient's hypoproteinemia/hypoproteinemia but could also be seen in the setting of inflammation. 4. The common hepatic duct measures up to 0.9 cm which is within normal limits. There is a suggestion of abrupt truncation of the distal CBD which is less appreciable on the axial images. This is of uncertain significance but makes it difficult to totally exclude the possibility of a small distal CBD stone. The patient terminated the exam prior to obtaining formal MRCP images. 5.  Low signal intensity in the spleen on diffusion-weighted images. This is nonspecific although can have an association with multiple myeloma. Electronically Signed   By: Van Clines M.D.   On: 01/01/2020 07:50    Assessment and Plan:   1. Preprocedure  cardiac evaluation: -Patient is needing EGD  -Revised Cardiac Risk Index low risk  -[facility] Activity Status Index is unable to be obtained at this time due to patient refusal to cooperate with consultation  -Will need to discuss with her daughter when she arrives -No further cardiac testing is needed for low risk noncardiac procedure and she may proceed when electrolytes have been adequately repleted and optimized from a medical perspective   2. Hypokalemia: -Recommend repletion to goal of 4.0 prior to undergoing procedure -Status post IV replacement -Labs unable to be drawn this morning due to patient refusal/cooperation  -Magnesium at goal as of 6/29  3. AKI: -Likely ATN in the setting of dehydration and hypotension -Improving -Per IM  4. Elevated HS-Tn: -Never with angina -Minimally elevated and down trending -Not consistent with ACS -No indication for heparin gtt -EKG not acute -Uncertain if she would tolerate or allow for an echo given limitations noted on consult today and prior imaging   5. Hypoalbuminemia: -In the setting of poor PO intake -Management per primary service   6. Weight loss/poor po intake/agitation: -Imaging not complete due to patient motion and termination of study -Management per IM -Patient pulls her arm into her gown this morning to prevent aide from checking BP   For questions or updates, please contact [facility] Please consult [email or web address] for contact info under Cardiology/STEMI.   Signed, Christell Faith, PA-C [place] Pager: [phone] 01/01/2020, 8:22 AM

## 2020-01-01 NOTE — Progress Notes (Addendum)
PROGRESS NOTE    Katie Caldwell   UXL:[id number]  DOB: 11/12/1945  PCP: Rusty Aus, MD    DOA: 12/30/2019 LOS: 1   Brief Narrative   HPI and Hospital Course Summarized: Katie Caldwell is a 74 y.o. 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  Labs showed significant hypokalemia of 1.7, hypomagnesemia of 1.4, and new AKI with creatinine up to 1.5, prompting referral to the ED.   Daughter provided additional history upon admission.  She had come to visit from [place] and noticed significant weight loss since the last time she saw her mother.  Patient had reported to daughter that she has had no appetite, lost her taste for foods, progressive generalized weakness and fatigue, and becomes easily tired and short of breath with walking short distances.  No abdominal pain, nausea, vomiting, diarrhea or other associated complaints.  Admitted to hospitalist service.  GI is consulted.     Assessment & Plan   Principal Problem:   Hypokalemia Active Problems:   Hypomagnesemia   AKI (acute kidney injury) (HCC)   Elevated troponin   Hypertension   Hypokalemia Hypomagnesemia Hypochloremia Above POA, likely secondary to nutritional deficiency and poor intake which is of uncertain etiology.  Continue to replace electrolytes, pharmacy consulted.  IV hydration as well.  Daily BMP and Mg levels.    Acute kidney injury - POA with Cr 1.61, improving with IV hydration.  Baseline creatinine approximately 0.7 (on 07/04/2019).  Continue IV fluid, monitor BMP.  Avoid nephrotoxins and hypotension.  Abdominal Pain due to ?GERD vs PUD vs Gastritis - Pt reported she was having significant abdominal pain  whenever food would hit her stomach, but this no longer going on at time of admission, she attributes weight loss to this problem.  GI is consulted.  MRCP is pending.  Cardiology cleared for EGD.   Plan is for EGD tomorrow if electrolytes better.  Elevated troponin - POA with hs-troponins 70 >> 64.  Patient without chest pain.  Suspect demand ischemia.  Downward trend in absence of chest pain, not consistent with ACS.   Get stat troponin and ECG if chest pain occurs.  Essential hypertension - chronic, stable.  BP was soft on admission, amlodipine and lisinopril were held.  Can resume amlodipine today, will start at 5 mg (home dose 10 mg).  Continue hold lisinopril while renal function improves.  Chronic back pain - Continue home Percocet   Elevated anion gap - AG was 21 on admission, however now closed, due to electrolyte abnormalities.  Resolved.  Anorexia / Unintentional Weight Loss - Pt reported weight loss of approx 30 lbs over the last 1-2 months, due to "stomach issue" (however's issue is no longer ongoing).  Dietician consulted.  Patient BMI: Body mass index is 24.79 kg/m.   DVT prophylaxis: heparin injection 5,000 Units Start: 12/30/19 2245   Diet:  Diet Orders (From admission, onward)    Start     Ordered   01/01/20 0001  Diet NPO time specified Except for: Sips with Meds  Diet effective midnight       Question:  Except for  Answer:  Sips with Meds   12/31/19 1634  Code Status: Full Code    Subjective 01/01/20    Patient seen with daughter at bedside.  Patient was fairly agitated overnight, was given Seroquel and Ativan, also required a one-to-one sitter.  This morning when seen patient is sleeping comfortably, but becomes agitated with attempts at physical exam.  Daughter states she is unsure why patient is on oxycodone as outpatient.   Disposition Plan & Communication   Status is: Inpatient  Remains inpatient appropriate because:Ongoing diagnostic  testing needed not appropriate for outpatient work up and severe electrolytes derangements requiring IV replacement and close monitoring.   Dispo: The patient is from: Home              Anticipated d/c is to: TBD pending therapy evaluation once electrolytes stable.              Anticipated d/c date is: 3 days              Patient currently is not medically stable to d/c.        Family Communication: daughter at bedside during encounter    Consults, Procedures, Significant Events   Consultants:   Gastroenterology  Procedures:   None  Antimicrobials:   None    Objective   Vitals:   12/31/19 1500 12/31/19 1942 12/31/19 2314 01/01/20 0259  BP: 130/62 111/65 136/76 (!) 143/74  Pulse: 62 74 72 73  Resp: 17 16 16 16   Temp: 97.9 F (36.6 C) 97.8 F (36.6 C) 97.6 F (36.4 C) 98.4 F (36.9 C)  TempSrc: Oral  Oral Oral  SpO2: 98% 95% 98% 96%  Weight:      Height:        Intake/Output Summary (Last 24 hours) at 01/01/2020 0729 Last data filed at 01/01/2020 0300 Gross per 24 hour  Intake 2118.62 ml  Output 500 ml  Net 1618.62 ml   Filed Weights   12/30/19 1810  Weight: 67.6 kg    Physical Exam:  General exam: Sleeping comfortably, no acute distress Respiratory system: CTAB, no wheezes, rales or rhonchi, normal respiratory effort. Cardiovascular system: normal S1/S2, RRR, trace lower extremity edema.   Gastrointestinal system: Exam limited by patient agitation and refusal, soft, NT, ND, unable to auscultate for bowel sounds Extremities: moves all, normal tone Skin: dry, intact, normal temperature, normal color Psychiatry: sleeping but agitated mood when awakens, congruent affect  Labs   Data Reviewed: I have personally reviewed following labs and imaging studies  CBC: Recent Labs  Lab 12/30/19 1830 12/31/19 0642  WBC 8.5 7.0  NEUTROABS 5.2  --   HGB 12.6 12.7  HCT 35.7* 36.2  MCV 88.1 88.5  PLT 240 371   Basic Metabolic Panel: Recent Labs  Lab  12/30/19 1830 12/31/19 0642 12/31/19 1713  NA 135 137 140  K <2.0* 2.1* 2.2*  CL 79* 87* 89*  CO2 35* 36* 35*  GLUCOSE 114* 123* 142*  BUN 22 19 15   CREATININE 1.61* 1.21* 1.18*  CALCIUM 8.5* 8.8* 8.9  MG 1.4* 2.0  --   PHOS  --  1.9*  --    GFR: Estimated Creatinine Clearance: 37.6 mL/min (A) (by C-G formula based on SCr of 1.18 mg/dL (H)). Liver Function Tests: Recent Labs  Lab 12/31/19 1713  AST 50*  ALT 16  ALKPHOS 62  BILITOT 1.3*  PROT 6.4*  ALBUMIN 2.9*   No results for input(s): LIPASE, AMYLASE in the last 168 hours. No results for input(s): AMMONIA in the last 168 hours. Coagulation Profile:  No results for input(s): INR, PROTIME in the last 168 hours. Cardiac Enzymes: No results for input(s): CKTOTAL, CKMB, CKMBINDEX, TROPONINI in the last 168 hours. BNP (last 3 results) No results for input(s): PROBNP in the last 8760 hours. HbA1C: No results for input(s): HGBA1C in the last 72 hours. CBG: No results for input(s): GLUCAP in the last 168 hours. Lipid Profile: No results for input(s): CHOL, HDL, LDLCALC, TRIG, CHOLHDL, LDLDIRECT in the last 72 hours. Thyroid Function Tests: No results for input(s): TSH, T4TOTAL, FREET4, T3FREE, THYROIDAB in the last 72 hours. Anemia Panel: No results for input(s): VITAMINB12, FOLATE, FERRITIN, TIBC, IRON, RETICCTPCT in the last 72 hours. Sepsis Labs: No results for input(s): PROCALCITON, LATICACIDVEN in the last 168 hours.  Recent Results (from the past 240 hour(s))  SARS CORONAVIRUS 2 (TAT 6-24 HRS) Nasopharyngeal Nasopharyngeal Swab     Status: None   Collection Time: 12/30/19  7:31 PM   Specimen: Nasopharyngeal Swab  Result Value Ref Range Status   SARS Coronavirus 2 NEGATIVE NEGATIVE Final    Comment: (NOTE) SARS-CoV-2 target nucleic acids are NOT DETECTED.  The SARS-CoV-2 RNA is generally detectable in upper and lower respiratory specimens during the acute phase of infection. Negative results do not preclude  SARS-CoV-2 infection, do not rule out co-infections with other pathogens, and should not be used as the sole basis for treatment or other patient management decisions. Negative results must be combined with clinical observations, patient history, and epidemiological information. The expected result is Negative.  Fact Sheet for Patients: SugarRoll.be  Fact Sheet for Healthcare Providers: https://www.woods-mathews.com/  This test is not yet approved or cleared by the [place] FDA and  has been authorized for detection and/or diagnosis of SARS-CoV-2 by FDA under an Emergency Use Authorization (EUA). This EUA will remain  in effect (meaning this test can be used) for the duration of the COVID-19 declaration under Se ction 564(b)(1) of the Act, 21 U.S.C. section 360bbb-3(b)(1), unless the authorization is terminated or revoked sooner.  Performed at [facility], [place] [address], [place], [place] [address]       Imaging Studies   No results found.   Medications   Scheduled Meds: . heparin  5,000 Units Subcutaneous Q8H  . oxyCODONE-acetaminophen  1 tablet Oral Daily  . rosuvastatin  10 mg Oral QHS  . sodium chloride flush  3 mL Intravenous Q12H  . vitamin B-12  1,000 mcg Oral Daily   Continuous Infusions: . dextrose 5% lactated ringers with KCl 20 mEq/L 100 mL/hr at 01/01/20 0300       LOS: 1 day    Time spent: 30 minutes    Ezekiel Slocumb, DO [facility]  01/01/2020, 7:29 AM    If 7PM-7AM, please contact night-coverage. How to contact the [facility] Attending or Consulting provider [place] or covering provider during after hours [place], for this patient?    1. Check the care team in [facility] and look for a) attending/consulting [facility] provider listed and b) the [facility] team listed 2. Log into [email or web address] and use [facility]'s universal password to access. If you do not have the password, please contact the hospital  operator. 3. Locate the [facility] provider you are looking for under [facility] and page to a number that you can be directly reached. 4. If you still have difficulty reaching the provider, please page the [facility] (Director on Call) for the Hospitalists listed on amion for assistance.

## 2020-01-02 ENCOUNTER — Encounter: Payer: Self-pay | Admitting: Internal Medicine

## 2020-01-02 ENCOUNTER — Inpatient Hospital Stay: Payer: Medicare PPO | Admitting: Registered Nurse

## 2020-01-02 ENCOUNTER — Encounter
Admission: EM | Disposition: A | Discharge status: Home Health | Payer: Self-pay | Source: Ambulatory Visit | Attending: Internal Medicine

## 2020-01-02 DIAGNOSIS — R1319 Other dysphagia: Secondary | ICD-10-CM

## 2020-01-02 HISTORY — PX: ESOPHAGOGASTRODUODENOSCOPY: SHX5428

## 2020-01-02 LAB — MAGNESIUM
Magnesium: 1.8 mg/dL (ref 1.7–2.4)
Magnesium: 1.5 mg/dL — ABNORMAL LOW (ref 1.7–2.4)
Magnesium: 1.4 mg/dL — ABNORMAL LOW (ref 1.7–2.4)

## 2020-01-02 LAB — POTASSIUM
Potassium: 3 mmol/L — ABNORMAL LOW (ref 3.5–5.1)
Potassium: 3 mmol/L — ABNORMAL LOW (ref 3.5–5.1)

## 2020-01-02 LAB — COMPREHENSIVE METABOLIC PANEL
ALT: 13 U/L (ref 0–44)
AST: 36 U/L (ref 15–41)
Albumin: 2.8 g/dL — ABNORMAL LOW (ref 3.5–5.0)
Alkaline Phosphatase: 58 U/L (ref 38–126)
Anion gap: 14 (ref 5–15)
BUN: <5 mg/dL — ABNORMAL LOW (ref 8–23)
CO2: 35 mmol/L — ABNORMAL HIGH (ref 22–32)
Calcium: 8.6 mg/dL — ABNORMAL LOW (ref 8.9–10.3)
Chloride: 93 mmol/L — ABNORMAL LOW (ref 98–111)
Creatinine, Ser: 0.75 mg/dL (ref 0.44–1.00)
GFR calc Af Amer: >60 mL/min (ref >60)
GFR calc non Af Amer: >60 mL/min (ref >60)
Glucose, Bld: 101 mg/dL — ABNORMAL HIGH (ref 70–99)
Potassium: 2.8 mmol/L — ABNORMAL LOW (ref 3.5–5.1)
Sodium: 142 mmol/L (ref 135–145)
Total Bilirubin: 0.8 mg/dL (ref 0.3–1.2)
Total Protein: 6 g/dL — ABNORMAL LOW (ref 6.5–8.1)

## 2020-01-02 SURGERY — EGD (ESOPHAGOGASTRODUODENOSCOPY)
Anesthesia: General

## 2020-01-02 MED ORDER — PROPOFOL 10 MG/ML IV BOLUS
INTRAVENOUS | Status: DC | PRN
  Administered 2020-01-02: 70 mg via INTRAVENOUS

## 2020-01-02 MED ORDER — PROPOFOL 500 MG/50ML IV EMUL
INTRAVENOUS | Status: DC | PRN
  Administered 2020-01-02: 150 ug/kg/min via INTRAVENOUS

## 2020-01-02 MED ORDER — POTASSIUM CHLORIDE 10 MEQ/100ML IV SOLN
10.0000 meq | INTRAVENOUS | Status: AC
  Administered 2020-01-02 – 2020-01-03 (×5): 10 meq via INTRAVENOUS
  Filled 2020-01-02 (×3): qty 100

## 2020-01-02 MED ORDER — POTASSIUM CHLORIDE 10 MEQ/100ML IV SOLN
10.0000 meq | INTRAVENOUS | Status: AC
  Administered 2020-01-02 (×3): 10 meq via INTRAVENOUS
  Filled 2020-01-02: qty 100

## 2020-01-02 MED ORDER — MAGNESIUM SULFATE 4 GM/100ML IV SOLN
4.0000 g | Freq: Once | INTRAVENOUS | Status: DC
  Filled 2020-01-02: qty 100

## 2020-01-02 MED ORDER — POTASSIUM CHLORIDE 20 MEQ PO PACK: 40.0000 meq | PACK | ORAL | Status: AC

## 2020-01-02 MED ORDER — MAGNESIUM SULFATE 4 GM/100ML IV SOLN
4.0000 g | Freq: Once | INTRAVENOUS | Status: AC
  Administered 2020-01-02: 4 g via INTRAVENOUS
  Filled 2020-01-02: qty 100

## 2020-01-02 MED ORDER — POTASSIUM CHLORIDE 20 MEQ PO PACK: 40.0000 meq | PACK | ORAL | Status: DC

## 2020-01-02 MED ORDER — SODIUM CHLORIDE 0.9 % IV SOLN: INTRAVENOUS | Status: DC

## 2020-01-02 MED ORDER — LIDOCAINE HCL (CARDIAC) PF 100 MG/5ML IV SOSY
PREFILLED_SYRINGE | INTRAVENOUS | Status: DC | PRN
  Administered 2020-01-02: 60 mg via INTRAVENOUS
  Administered 2020-01-02: 40 mg via INTRAVENOUS

## 2020-01-02 NOTE — Progress Notes (Addendum)
Pt has critical lab value of potassium 2.7. Randol Kern, NP (on-call provider) notified; Potassium replacement ordered.

## 2020-01-02 NOTE — Transfer of Care (Signed)
Immediate Anesthesia Transfer of Care Note  Patient: Katie Caldwell  Procedure(s) Performed: ESOPHAGOGASTRODUODENOSCOPY (EGD) (N/A )  Patient Location: Endoscopy Unit  Anesthesia Type:General  Level of Consciousness: drowsy  Airway & Oxygen Therapy: Patient Spontanous Breathing  Post-op Assessment: Report given to RN and Post -op Vital signs reviewed and stable  Post vital signs: Reviewed and stable  Last Vitals:  Vitals Value Taken Time  BP 121/71 01/02/20 1504  Temp    Pulse 65 01/02/20 1504  Resp 34 01/02/20 1504  SpO2 100 % 01/02/20 1504  Vitals shown include unvalidated device data.  Last Pain:  Vitals:   01/02/20 1318  TempSrc: Temporal  PainSc: 0-No pain         Complications: No complications documented.

## 2020-01-02 NOTE — Consult Note (Signed)
PHARMACY CONSULT NOTE - FOLLOW UP  Pharmacy Consult for Electrolyte Monitoring and Replacement   Recent Labs: Potassium (mmol/L)  Date Value  01/02/2020 3.0 (L)  08/01/2014 3.5   Magnesium (mg/dL)  Date Value  01/02/2020 1.4 (L)   Calcium (mg/dL)  Date Value  01/02/2020 8.6 (L)   Calcium, Total (mg/dL)  Date Value  08/01/2014 9.1   Albumin (g/dL)  Date Value  01/02/2020 2.8 (L)  07/31/2014 3.3 (L)   Phosphorus (mg/dL)  Date Value  01/01/2020 2.5   Sodium (mmol/L)  Date Value  01/02/2020 142  08/01/2014 138   Assessment: Pharmacy consulted for electrolyte monitoring and replacement for 74 yo female admitted with hypokalemia and hypomagnesemia. Potassium on admission <2.  Per chart review, patient is not currently experiencing nausea or vomiting.  Goal of Therapy:  Cardiology recommends goal K of 4 prior to procedure. All other electrolytes WNL   Plan:  07/01 @ 2200 K+ 3.0 will replace w/ KCI 10 mEq IV x 5 and increase the D5LR + 20 mEq of KCI to 125 ml/hr and will recheck all electrolytes w/ am labs and continue to monitor.  Tobie Lords, PharmD Clinical Pharmacist 01/02/2020 10:18 PM

## 2020-01-02 NOTE — Op Note (Signed)
[facility] Gastroenterology Patient Name: Katie Caldwell Procedure Date: 01/02/2020 2:22 PM MRN: [id number] Account #: [id number] Date of Birth: 08/28/1945 Admit Type: Inpatient Age: 74 Room: [facility] ENDO ROOM 4 Gender: Female Note Status: Finalized Procedure:             Upper GI endoscopy Indications:           Dysphagia, Weight loss Providers:             Azaleah Usman B. Bonna Gains MD, MD Referring MD:          Rusty Aus, MD (Referring MD) Medicines:             Monitored Anesthesia Care Complications:         No immediate complications. Procedure:             Pre-Anesthesia Assessment:                        - Prior to the procedure, a History and Physical was                         performed, and patient medications, allergies and                         sensitivities were reviewed. The patient's tolerance                         of previous anesthesia was reviewed.                        - The risks and benefits of the procedure and the                         sedation options and risks were discussed with the                         patient. All questions were answered and informed                         consent was obtained.                        - Patient identification and proposed procedure were                         verified prior to the procedure by the physician, the                         nurse, the anesthesiologist, the anesthetist and the                         technician. The procedure was verified in the                         procedure room.                        - ASA Grade Assessment: II - A patient with mild                         systemic  disease.                        After obtaining informed consent, the endoscope was                         passed under direct vision. Throughout the procedure,                         the patient's blood pressure, pulse, and oxygen                         saturations were monitored  continuously. The Endoscope                         was introduced through the mouth, and advanced to the                         second part of duodenum. The upper GI endoscopy was                         accomplished with ease. The patient tolerated the                         procedure well. Findings:      The examined esophagus was normal.      The distal esophagus was tortuous.      The entire examined stomach was normal.      The duodenal bulb, second portion of the duodenum and examined duodenum       were normal. Impression:            - Normal esophagus.                        - Tortuous esophagus.                        - Normal stomach.                        - Normal duodenal bulb, second portion of the duodenum                         and examined duodenum.                        - No specimens collected. Recommendation:        - Consider esophageal manometry as an outpatient                        - Continue present medications.                        - Patient has a contact number available for                         emergencies. The signs and symptoms of potential                         delayed complications were discussed with the patient.  Return to normal activities tomorrow. Written                         discharge instructions were provided to the patient.                        - The findings and recommendations were discussed with                         the patient.                        - Return to my office in 4 weeks. Procedure Code(s):     --- Professional ---                        [phone], Esophagogastroduodenoscopy, flexible,                         transoral; diagnostic, including collection of                         specimen(s) by brushing or washing, when performed                         (separate procedure) Diagnosis Code(s):     --- Professional ---                        Q39.9, Congenital malformation of esophagus,                          unspecified                        R13.10, Dysphagia, unspecified                        R63.4, Abnormal weight loss CPT copyright 2019 American Medical Association. All rights reserved. The codes documented in this report are preliminary and upon coder review may  be revised to meet current compliance requirements.  [name], MD Margretta Sidle B. Bonna Gains MD, MD 01/02/2020 3:07:47 PM This report has been signed electronically. Number of Addenda: 0 Note Initiated On: 01/02/2020 2:22 PM Estimated Blood Loss:  Estimated blood loss: none.      [facility]

## 2020-01-02 NOTE — Consult Note (Signed)
PHARMACY CONSULT NOTE - FOLLOW UP  Pharmacy Consult for Electrolyte Monitoring and Replacement   Recent Labs: Potassium (mmol/L)  Date Value  01/02/2020 2.8 (L)  08/01/2014 3.5   Magnesium (mg/dL)  Date Value  01/02/2020 1.5 (L)   Calcium (mg/dL)  Date Value  01/02/2020 8.6 (L)   Calcium, Total (mg/dL)  Date Value  08/01/2014 9.1   Albumin (g/dL)  Date Value  01/02/2020 2.8 (L)  07/31/2014 3.3 (L)   Phosphorus (mg/dL)  Date Value  01/01/2020 2.5   Sodium (mmol/L)  Date Value  01/02/2020 142  08/01/2014 138   Assessment: Pharmacy consulted for electrolyte monitoring and replacement for 74 yo female admitted with hypokalemia and hypomagnesemia. Potassium on admission <2.  Per chart review, patient is not currently experiencing nausea or vomiting.  Goal of Therapy:  Cardiology recommends goal K of 4 prior to procedure. All other electrolytes WNL   Plan:  7/1 @ 1202 K 3.0 following 2 runs of IV potassium 10 mEq Q1 hour.  Will finish remaining 4 runs as ordered by MD.  Patient also on D5LR + 20 mEq/L KCL at 100 mL/hr.  MD ordered KCl PO 40 mEq x 2 doses, and ordered magnesium 4g IV x 1 dose as well.  Will follow K at 2100 tonight.   Gerald Dexter, PharmD Clinical Pharmacist 01/02/2020 7:34 AM

## 2020-01-02 NOTE — Progress Notes (Signed)
PROGRESS NOTE    Katie Caldwell   HYQ:[id number]  DOB: [birth year]  PCP: Katie Aus, MD    DOA: 12/30/2019 LOS: 2   Brief Narrative   HPI and Hospital Course Summarized: Katie Caldwell is a 74 y.o. 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  Labs showed significant hypokalemia of 1.7, hypomagnesemia of 1.4, and new AKI with creatinine up to 1.5, prompting referral to the ED.   Daughter provided additional history upon admission.  She had come to visit from [place] and noticed significant weight loss since the last time she saw her mother.  Patient had reported to daughter that she has had no appetite, lost her taste for foods, progressive generalized weakness and fatigue, and becomes easily tired and short of breath with walking short distances.  No abdominal pain, nausea, vomiting, diarrhea or other associated complaints.  Admitted to hospitalist service.  GI is consulted.     Assessment & Plan   Principal Problem:   Hypokalemia Active Problems:   Hypomagnesemia   AKI (acute kidney injury) (HCC)   Elevated troponin   Hypertension   Pre-op evaluation   Hypokalemia - POA, persistent Hypomagnesemia - POA, persistent Hypochloremia - POA, improving Above POA, likely secondary to nutritional deficiency and very poor intake in setting of alcohol use.  GI evaluation is ongoing.  Continue to aggressively replace electrolytes, pharmacy consulted.  IV hydration as well.  Daily BMP and Mg levels.    Acute kidney injury - POA with Cr 1.61, improving with IV hydration.  Baseline creatinine approximately 0.7 (on 07/04/2019).  Continue IV fluid, monitor BMP.  Avoid nephrotoxins and  hypotension.  Abdominal Pain due to ?GERD vs PUD vs Gastritis - resolved, no pain today. Pt reported she was having significant abdominal pain whenever food would hit her stomach, but this no longer going on at time of admission, she attributes weight loss to this problem.  Suspect alcohol-induced gastritis.  GI is consulted.  MRCP was poor quality due to motion artifact, but showed normal caliber common bile duct with "abrupt truncation of the distal CBD".  GI plans for outpatient EUS to further evaluate this finding further.  No indication for ERCP at this time.  Cardiology cleared for EGD.   Plan is for EGD this afternoon if electrolytes adequate for anesthesia.  Elevated troponin - POA with hs-troponins 70 >> 64.  Patient without chest pain.  Suspect demand ischemia.  Downward trend in absence of chest pain, not consistent with ACS.   Get stat troponin and ECG if chest pain occurs.  Essential hypertension - chronic, stable.  BP was soft on admission, amlodipine and lisinopril were held.  Can resume amlodipine today, will start at 5 mg (home dose 10 mg).  Continue hold lisinopril while renal function improves.  Chronic back pain - Continue home Percocet   Elevated anion gap - AG was 21 on admission, however now closed, due to electrolyte abnormalities.  Resolved.  Anorexia / Unintentional Weight Loss - Pt reported weight loss of approx 30 lbs over the last 1-2 months, due to "stomach issue" (however's issue is no longer ongoing).  Dietician consulted.  Patient BMI: Body mass index is 24.79 kg/m.   DVT prophylaxis:  enoxaparin (LOVENOX) injection 40 mg Start: 01/01/20 2200   Diet:  Diet Orders (From admission, onward)    Start     Ordered   01/02/20 0001  Diet NPO time specified  Diet effective midnight        01/01/20 1538            Code Status: Full Code    Subjective 01/02/20    Patient seen with husband at bedside.  No acute events or overnight agitation reported.   Patient awake and reports she feels well today. Denies any abdominal pain, nausea or vomiting.  Has been NPO for EGD, so has not eaten today.  Also denies fever/chills or other acute complaints.  Says she wants to go home.    Disposition Plan & Communication   Status is: Inpatient  Remains inpatient appropriate because:Ongoing diagnostic testing needed not appropriate for outpatient work up and severe electrolytes derangements requiring IV replacement and close monitoring.   Dispo: The patient is from: Home              Anticipated d/c is to: TBD pending therapy evaluation once electrolytes stable.              Anticipated d/c date is: 3 days              Patient currently is not medically stable to d/c.   Family Communication: husband at bedside during encounter    Consults, Procedures, Significant Events   Consultants:   Gastroenterology  Procedures:   None  Antimicrobials:   None    Objective   Vitals:   01/01/20 1948 01/01/20 2348 01/02/20 0418 01/02/20 0723  BP: (!) 159/75 129/69 139/84 128/71  Pulse: 66 71 79 72  Resp: 17 (!) 21 16 17   Temp: 98 F (36.7 C) 97.9 F (36.6 C) 97.8 F (36.6 C) 98.3 F (36.8 C)  TempSrc:  Oral Oral   SpO2: 93% 91% 94% 94%  Weight:      Height:        Intake/Output Summary (Last 24 hours) at 01/02/2020 0726 Last data filed at 01/02/2020 0315 Gross per 24 hour  Intake 2697.11 ml  Output --  Net 2697.11 ml   Filed Weights   12/30/19 1810  Weight: 67.6 kg    Physical Exam:  General exam: awake, alert, no acute distress Respiratory system: CTAB, no wheezes, rales or rhonchi, normal respiratory effort. Cardiovascular system: normal S1/S2, RRR, trace lower extremity edema.   Gastrointestinal system: soft, NT, ND, +bowel sounds Extremities: moves all, normal tone Skin: dry, intact, normal temperature, normal color Psychiatry: normal mood, congruent affect, judgement and insight appear normal  Labs   Data Reviewed: I  have personally reviewed following labs and imaging studies  CBC: Recent Labs  Lab 12/30/19 1830 12/31/19 0642 01/01/20 1109  WBC 8.5 7.0 7.0  NEUTROABS 5.2  --   --   HGB 12.6 12.7 12.4  HCT 35.7* 36.2 35.9*  MCV 88.1 88.5 89.8  PLT 240 231 329   Basic Metabolic Panel: Recent Labs  Lab 12/30/19 1830 12/30/19 1830 12/31/19 0642 12/31/19 1713 01/01/20 1109 01/01/20 2248 01/02/20 0626  NA 135  --  137 140 142  --  142  K <2.0*   < > 2.1* 2.2* 2.8* 2.7* 2.8*  CL 79*  --  87* 89* 92*  --  93*  CO2 35*  --  36* 35* 37*  --  35*  GLUCOSE 114*  --  123* 142* 112*  --  101*  BUN 22  --  19 15 9   --  <5*  CREATININE 1.61*  --  1.21* 1.18* 0.92  --  0.75  CALCIUM 8.5*  --  8.8* 8.9 8.8*  --  8.6*  MG 1.4*  --  2.0  --  1.3* 1.8 1.5*  PHOS  --   --  1.9*  --  2.5  --   --    < > = values in this interval not displayed.   GFR: Estimated Creatinine Clearance: 55.5 mL/min (by C-G formula based on SCr of 0.75 mg/dL). Liver Function Tests: Recent Labs  Lab 12/31/19 1713 01/01/20 1109 01/02/20 0626  AST 50* 45*  44* 36  ALT 16 15  15 13   ALKPHOS 62 59  59 58  BILITOT 1.3* 0.8  0.8 0.8  PROT 6.4* 6.2*  6.2* 6.0*  ALBUMIN 2.9* 2.8*  2.8* 2.8*   No results for input(s): LIPASE, AMYLASE in the last 168 hours. No results for input(s): AMMONIA in the last 168 hours. Coagulation Profile: No results for input(s): INR, PROTIME in the last 168 hours. Cardiac Enzymes: No results for input(s): CKTOTAL, CKMB, CKMBINDEX, TROPONINI in the last 168 hours. BNP (last 3 results) No results for input(s): PROBNP in the last 8760 hours. HbA1C: No results for input(s): HGBA1C in the last 72 hours. CBG: No results for input(s): GLUCAP in the last 168 hours. Lipid Profile: No results for input(s): CHOL, HDL, LDLCALC, TRIG, CHOLHDL, LDLDIRECT in the last 72 hours. Thyroid Function Tests: No results for input(s): TSH, T4TOTAL, FREET4, T3FREE, THYROIDAB in the last 72 hours. Anemia  Panel: No results for input(s): VITAMINB12, FOLATE, FERRITIN, TIBC, IRON, RETICCTPCT in the last 72 hours. Sepsis Labs: No results for input(s): PROCALCITON, LATICACIDVEN in the last 168 hours.  Recent Results (from the past 240 hour(s))  SARS CORONAVIRUS 2 (TAT 6-24 HRS) Nasopharyngeal Nasopharyngeal Swab     Status: None   Collection Time: 12/30/19  7:31 PM   Specimen: Nasopharyngeal Swab  Result Value Ref Range Status   SARS Coronavirus 2 NEGATIVE NEGATIVE Final    Comment: (NOTE) SARS-CoV-2 target nucleic acids are NOT DETECTED.  The SARS-CoV-2 RNA is generally detectable in upper and lower respiratory specimens during the acute phase of infection. Negative results do not preclude SARS-CoV-2 infection, do not rule out co-infections with other pathogens, and should not be used as the sole basis for treatment or other patient management decisions. Negative results must be combined with clinical observations, patient history, and epidemiological information. The expected result is Negative.  Fact Sheet for Patients: SugarRoll.be  Fact Sheet for Healthcare Providers: https://www.woods-mathews.com/  This test is not yet approved or cleared by the [place] FDA and  has been authorized for detection and/or diagnosis of SARS-CoV-2 by FDA under an Emergency Use Authorization (EUA). This EUA will remain  in effect (meaning this test can be used) for the duration of the COVID-19 declaration under Se ction 564(b)(1) of the Act, 21 U.S.C. section 360bbb-3(b)(1), unless the authorization is terminated or revoked sooner.  Performed at [facility], [place] [address] [place], [place] [address]       Imaging Studies   MR ABDOMEN MRCP WO CONTRAST  Result Date: 01/01/2020 CLINICAL DATA:  Hypokalemia, low appetite, weight loss. EXAM: MRI ABDOMEN WITHOUT CONTRAST  (INCLUDING MRCP) TECHNIQUE: Multiplanar multisequence MR imaging of the  abdomen was performed. Heavily T2-weighted images of the biliary and pancreatic ducts were attempted, but the patient terminated the exam  prior to acquisition. COMPARISON:  CT abdomen from 02/21/2013 FINDINGS: Despite efforts by the technologist and patient, motion artifact is present on today's exam and could not be eliminated. This reduces exam sensitivity and specificity. Lower chest: Unremarkable Hepatobiliary: Mild hepatic steatosis. Mild gallbladder wall thickening which may be related to the patient's hypoproteinemia/hypo but could also be seen in the setting inflammation. Probable sludge in the gallbladder. The common hepatic duct measures up to 0.9 cm, mildly prominent, with the common bile duct measuring up to 0.6 cm which is within normal limits. On image 19/3 there is a suggestion of abrupt truncation of the distal CBD which is less appreciable on the axial images this is of uncertain significance but makes it difficult to totally exclude the possibility of a small distal CBD stone. I not see a mass in the head of the pancreas or ampulla, with the understanding that today's exam was performed without IV contrast which limits diagnostic sensitivity and specificity. Pancreas:  Unremarkable Spleen: Low signal intensity in the spleen on diffusion-weighted images. This is nonspecific although can have an association with multiple myeloma. Adrenals/Urinary Tract: Fluid signal intensity 1.7 cm lesion of the right kidney upper pole favoring benign cyst. Adrenal glands unremarkable. Stomach/Bowel: Unremarkable Vascular/Lymphatic: No pathologic adenopathy. Abdominal aortic atherosclerotic vascular disease. Other:  No supplemental non-categorized findings. Musculoskeletal: Chronic Schmorl's node along the superior endplate of S1. Multilevel lumbar and thoracic degenerative endplate findings. IMPRESSION: 1. Despite efforts by the technologist and patient, motion artifact is present on today's exam and could not be  eliminated. This reduces exam sensitivity and specificity. 2. Mild hepatic steatosis. 3. Probable sludge in the gallbladder. Mild gallbladder wall thickening which may be related to the patient's hypoproteinemia/hypoproteinemia but could also be seen in the setting of inflammation. 4. The common hepatic duct measures up to 0.9 cm which is within normal limits. There is a suggestion of abrupt truncation of the distal CBD which is less appreciable on the axial images. This is of uncertain significance but makes it difficult to totally exclude the possibility of a small distal CBD stone. The patient terminated the exam prior to obtaining formal MRCP images. 5. Low signal intensity in the spleen on diffusion-weighted images. This is nonspecific although can have an association with multiple myeloma. Electronically Signed   By: Van Clines M.D.   On: 01/01/2020 07:50   MR 3D Recon At Scanner  Result Date: 01/01/2020 CLINICAL DATA:  Hypokalemia, low appetite, weight loss. EXAM: MRI ABDOMEN WITHOUT CONTRAST  (INCLUDING MRCP) TECHNIQUE: Multiplanar multisequence MR imaging of the abdomen was performed. Heavily T2-weighted images of the biliary and pancreatic ducts were attempted, but the patient terminated the exam prior to acquisition. COMPARISON:  CT abdomen from 02/21/2013 FINDINGS: Despite efforts by the technologist and patient, motion artifact is present on today's exam and could not be eliminated. This reduces exam sensitivity and specificity. Lower chest: Unremarkable Hepatobiliary: Mild hepatic steatosis. Mild gallbladder wall thickening which may be related to the patient's hypoproteinemia/hypo but could also be seen in the setting inflammation. Probable sludge in the gallbladder. The common hepatic duct measures up to 0.9 cm, mildly prominent, with the common bile duct measuring up to 0.6 cm which is within normal limits. On image 19/3 there is a suggestion of abrupt truncation of the distal CBD which  is less appreciable on the axial images this is of uncertain significance but makes it difficult to totally exclude the possibility of a small distal CBD stone. I not see a mass  in the head of the pancreas or ampulla, with the understanding that today's exam was performed without IV contrast which limits diagnostic sensitivity and specificity. Pancreas:  Unremarkable Spleen: Low signal intensity in the spleen on diffusion-weighted images. This is nonspecific although can have an association with multiple myeloma. Adrenals/Urinary Tract: Fluid signal intensity 1.7 cm lesion of the right kidney upper pole favoring benign cyst. Adrenal glands unremarkable. Stomach/Bowel: Unremarkable Vascular/Lymphatic: No pathologic adenopathy. Abdominal aortic atherosclerotic vascular disease. Other:  No supplemental non-categorized findings. Musculoskeletal: Chronic Schmorl's node along the superior endplate of S1. Multilevel lumbar and thoracic degenerative endplate findings. IMPRESSION: 1. Despite efforts by the technologist and patient, motion artifact is present on today's exam and could not be eliminated. This reduces exam sensitivity and specificity. 2. Mild hepatic steatosis. 3. Probable sludge in the gallbladder. Mild gallbladder wall thickening which may be related to the patient's hypoproteinemia/hypoproteinemia but could also be seen in the setting of inflammation. 4. The common hepatic duct measures up to 0.9 cm which is within normal limits. There is a suggestion of abrupt truncation of the distal CBD which is less appreciable on the axial images. This is of uncertain significance but makes it difficult to totally exclude the possibility of a small distal CBD stone. The patient terminated the exam prior to obtaining formal MRCP images. 5. Low signal intensity in the spleen on diffusion-weighted images. This is nonspecific although can have an association with multiple myeloma. Electronically Signed   By: Van Clines M.D.   On: 01/01/2020 07:50   ECHOCARDIOGRAM COMPLETE  Result Date: 01/01/2020    ECHOCARDIOGRAM REPORT   Patient Name:   NERIAH BROTT Date of Exam: 01/01/2020 Medical Rec #:  [id number]           Height:       65.0 in Accession #:    [id number]          Weight:       317.5 lb Date of Birth:  September 28, 1945           BSA:          2.407 m Patient Age:    68 years            BP:           144/82 mmHg Patient Gender: F                   HR:           67 bpm. Exam Location:  [facility] Procedure: 2D Echo, Cardiac Doppler and Color Doppler Indications:     Abnormal ECG 794.31  History:         Patient has no prior history of Echocardiogram examinations.                  Risk Factors:Hypertension.  Sonographer:     Sherrie Sport RDCS (AE) Referring Phys:  [id number] Kate Sable Diagnosing Phys: Kate Sable MD IMPRESSIONS  1. Left ventricular ejection fraction, by estimation, is 60 to 65%. The left ventricle has normal function. The left ventricle has no regional wall motion abnormalities. Left ventricular diastolic parameters are consistent with Grade I diastolic dysfunction (impaired relaxation).  2. Right ventricular systolic function is normal. The right ventricular size is normal.  3. The mitral valve is normal in structure. No evidence of mitral valve regurgitation. No evidence of mitral stenosis.  4. The aortic valve is grossly normal. Aortic valve regurgitation is not visualized. No aortic stenosis is  present. FINDINGS  Left Ventricle: Left ventricular ejection fraction, by estimation, is 60 to 65%. The left ventricle has normal function. The left ventricle has no regional wall motion abnormalities. The left ventricular internal cavity size was normal in size. There is  no left ventricular hypertrophy. Left ventricular diastolic parameters are consistent with Grade I diastolic dysfunction (impaired relaxation). Right Ventricle: The right ventricular size is normal. No increase in right ventricular  wall thickness. Right ventricular systolic function is normal. Left Atrium: Left atrial size was normal in size. Right Atrium: Right atrial size was normal in size. Pericardium: There is no evidence of pericardial effusion. Mitral Valve: The mitral valve is normal in structure. Normal mobility of the mitral valve leaflets. No evidence of mitral valve regurgitation. No evidence of mitral valve stenosis. Tricuspid Valve: The tricuspid valve is grossly normal. Tricuspid valve regurgitation is not demonstrated. No evidence of tricuspid stenosis. Aortic Valve: The aortic valve is grossly normal. Aortic valve regurgitation is not visualized. No aortic stenosis is present. Aortic valve mean gradient measures 4.0 mmHg. Aortic valve peak gradient measures 7.5 mmHg. Aortic valve area, by VTI measures 2.74 cm. Pulmonic Valve: The pulmonic valve was not well visualized. Pulmonic valve regurgitation is not visualized. No evidence of pulmonic stenosis. Aorta: The aortic root is normal in size and structure. Venous: The inferior vena cava was not well visualized. IAS/Shunts: No atrial level shunt detected by color flow Doppler.  LEFT VENTRICLE PLAX 2D LVIDd:         4.37 cm  Diastology LVIDs:         2.41 cm  LV e' lateral:   7.07 cm/s LV PW:         1.55 cm  LV E/e' lateral: 10.4 LV IVS:        0.86 cm  LV e' medial:    9.14 cm/s LVOT diam:     2.00 cm  LV E/e' medial:  8.0 LV SV:         84 LV SV Index:   35 LVOT Area:     3.14 cm  RIGHT VENTRICLE RV S prime:     14.40 cm/s TAPSE (M-mode): 1.3 cm LEFT ATRIUM           Index       RIGHT ATRIUM           Index LA diam:      3.50 cm 1.45 cm/m  RA Area:     18.80 cm LA Vol (A2C): 46.3 ml 19.23 ml/m RA Volume:   53.70 ml  22.31 ml/m LA Vol (A4C): 43.7 ml 18.15 ml/m  AORTIC VALVE                   PULMONIC VALVE AV Area (Vmax):    2.80 cm    PV Vmax:        0.36 m/s AV Area (Vmean):   2.51 cm    PV Peak grad:   0.5 mmHg AV Area (VTI):     2.74 cm    RVOT Peak grad: 4 mmHg AV  Vmax:           137.00 cm/s AV Vmean:          95.400 cm/s AV VTI:            0.306 m AV Peak Grad:      7.5 mmHg AV Mean Grad:      4.0 mmHg LVOT Vmax:  122.00 cm/s LVOT Vmean:        76.200 cm/s LVOT VTI:          0.267 m LVOT/AV VTI ratio: 0.87  AORTA Ao Root diam: 3.00 cm MITRAL VALVE               TRICUSPID VALVE MV Area (PHT): 2.83 cm    TR Peak grad:   44.6 mmHg MV Decel Time: 268 msec    TR Vmax:        334.00 cm/s MV E velocity: 73.20 cm/s MV A velocity: 96.50 cm/s  SHUNTS MV E/A ratio:  0.76        Systemic VTI:  0.27 m                            Systemic Diam: 2.00 cm Kate Sable MD Electronically signed by Kate Sable MD Signature Date/Time: 01/01/2020/5:43:49 PM    Final      Medications   Scheduled Meds: . amLODipine  5 mg Oral Daily  . enoxaparin (LOVENOX) injection  40 mg Subcutaneous Q24H  . oxyCODONE-acetaminophen  1 tablet Oral Daily  . rosuvastatin  10 mg Oral QHS  . sodium chloride flush  3 mL Intravenous Q12H  . vitamin B-12  1,000 mcg Oral Daily   Continuous Infusions: . dextrose 5% lactated ringers with KCl 20 mEq/L 100 mL/hr at 01/02/20 0557  . magnesium sulfate bolus IVPB    . potassium chloride         LOS: 2 days    Time spent: 30 minutes    Ezekiel Slocumb, DO [facility]  01/02/2020, 7:26 AM    If 7PM-7AM, please contact night-coverage. How to contact the [facility] Attending or Consulting provider [address] or covering provider during after hours [place], for this patient?    1. Check the care team in [facility] and look for a) attending/consulting [facility] provider listed and b) the [facility] team listed 2. Log into [email or web address] and use [facility]'s universal password to access. If you do not have the password, please contact the hospital operator. 3. Locate the [facility] provider you are looking for under [facility] and page to a number that you can be directly reached. 4. If you still have difficulty reaching the provider, please page the  [facility] (Director on Call) for the Hospitalists listed on amion for assistance.

## 2020-01-02 NOTE — Anesthesia Preprocedure Evaluation (Signed)
Anesthesia Evaluation  Patient identified by MRN, date of birth, ID band Patient awake    Reviewed: Allergy & Precautions, H&P , NPO status , Patient's Chart, lab work & pertinent test results, reviewed documented beta blocker date and time   Airway Mallampati: II   Neck ROM: full    Dental  (+) Poor Dentition   Pulmonary neg pulmonary ROS, former smoker,    Pulmonary exam normal        Cardiovascular Exercise Tolerance: Good hypertension, On Medications negative cardio ROS Normal cardiovascular exam Rhythm:regular Rate:Normal     Neuro/Psych negative neurological ROS  negative psych ROS   GI/Hepatic negative GI ROS, Neg liver ROS,   Endo/Other  negative endocrine ROS  Renal/GU Renal disease  negative genitourinary   Musculoskeletal   Abdominal   Peds  Hematology negative hematology ROS (+)   Anesthesia Other Findings Past Medical History: No date: Hypertension Past Surgical History: No date: ABDOMINAL HYSTERECTOMY No date: bladder surgery 02/01/2016: COLONOSCOPY WITH PROPOFOL; N/A     Comment:  Procedure: COLONOSCOPY WITH PROPOFOL;  Surgeon: Lollie Sails, MD;  Location: [facility] ENDOSCOPY;  Service:               Endoscopy;  Laterality: N/A; No date: TONSILLECTOMY BMI    Body Mass Index: 24.80 kg/m     Reproductive/Obstetrics negative OB ROS                             Anesthesia Physical Anesthesia Plan  ASA: II  Anesthesia Plan: General   Post-op Pain Management:    Induction:   PONV Risk Score and Plan:   Airway Management Planned:   Additional Equipment:   Intra-op Plan:   Post-operative Plan:   Informed Consent: I have reviewed the patients History and Physical, chart, labs and discussed the procedure including the risks, benefits and alternatives for the proposed anesthesia with the patient or authorized representative who has indicated his/her  understanding and acceptance.     Dental Advisory Given  Plan Discussed with: CRNA  Anesthesia Plan Comments:         Anesthesia Quick Evaluation

## 2020-01-02 NOTE — Consult Note (Signed)
PHARMACY CONSULT NOTE - FOLLOW UP  Pharmacy Consult for Electrolyte Monitoring and Replacement   Recent Labs: Potassium (mmol/L)  Date Value  01/01/2020 2.7 (LL)  08/01/2014 3.5   Magnesium (mg/dL)  Date Value  01/01/2020 1.8   Calcium (mg/dL)  Date Value  01/01/2020 8.8 (L)   Calcium, Total (mg/dL)  Date Value  08/01/2014 9.1   Albumin (g/dL)  Date Value  01/01/2020 2.8 (L)  01/01/2020 2.8 (L)  07/31/2014 3.3 (L)   Phosphorus (mg/dL)  Date Value  01/01/2020 2.5   Sodium (mmol/L)  Date Value  01/01/2020 142  08/01/2014 138   Assessment: Pharmacy consulted for electrolyte monitoring and replacement for 74 yo female admitted with hypokalemia and hypomagnesemia. Potassium on admission <2.   Goal of Therapy:  Electrolytes WNL   Plan:  06/30 @ 2330 K 2.7 patient is currently receiving KCI 10 mEq IV x 6 w/ D5LR + 20 mEq/L KCI at 100 ml/hr will continue current regimen and will recheck w/ am labs.  Tobie Lords, PharmD, BCPS Clinical Pharmacist 01/02/2020 1:32 AM

## 2020-01-03 ENCOUNTER — Encounter: Payer: Self-pay | Admitting: Gastroenterology

## 2020-01-03 DIAGNOSIS — R634 Abnormal weight loss: Secondary | ICD-10-CM

## 2020-01-03 LAB — COMPREHENSIVE METABOLIC PANEL
ALT: 13 U/L (ref 0–44)
AST: 35 U/L (ref 15–41)
Albumin: 2.8 g/dL — ABNORMAL LOW (ref 3.5–5.0)
Alkaline Phosphatase: 58 U/L (ref 38–126)
Anion gap: 11 (ref 5–15)
BUN: <5 mg/dL — ABNORMAL LOW (ref 8–23)
CO2: 31 mmol/L (ref 22–32)
Calcium: 8.6 mg/dL — ABNORMAL LOW (ref 8.9–10.3)
Chloride: 96 mmol/L — ABNORMAL LOW (ref 98–111)
Creatinine, Ser: 0.73 mg/dL (ref 0.44–1.00)
GFR calc Af Amer: >60 mL/min (ref >60)
GFR calc non Af Amer: >60 mL/min (ref >60)
Glucose, Bld: 103 mg/dL — ABNORMAL HIGH (ref 70–99)
Potassium: 3.4 mmol/L — ABNORMAL LOW (ref 3.5–5.1)
Sodium: 138 mmol/L (ref 135–145)
Total Bilirubin: 0.8 mg/dL (ref 0.3–1.2)
Total Protein: 6.4 g/dL — ABNORMAL LOW (ref 6.5–8.1)

## 2020-01-03 LAB — POTASSIUM: Potassium: 3.3 mmol/L — ABNORMAL LOW (ref 3.5–5.1)

## 2020-01-03 LAB — MAGNESIUM
Magnesium: 1.8 mg/dL (ref 1.7–2.4)
Magnesium: 1.8 mg/dL (ref 1.7–2.4)

## 2020-01-03 LAB — PHOSPHORUS: Phosphorus: 2.4 mg/dL — ABNORMAL LOW (ref 2.5–4.6)

## 2020-01-03 MED ORDER — MAGNESIUM OXIDE 400 (241.3 MG) MG PO TABS: 400.0000 mg | ORAL_TABLET | Freq: Two times a day (BID) | ORAL | 0 refills | Status: AC

## 2020-01-03 MED ORDER — MAGNESIUM OXIDE 400 (241.3 MG) MG PO TABS
400.0000 mg | ORAL_TABLET | Freq: Two times a day (BID) | ORAL | Status: DC
  Administered 2020-01-03: 400 mg via ORAL
  Filled 2020-01-03: qty 1

## 2020-01-03 MED ORDER — POTASSIUM CHLORIDE ER 20 MEQ PO TBCR: 40.0000 meq | EXTENDED_RELEASE_TABLET | Freq: Two times a day (BID) | ORAL | 0 refills | Status: AC

## 2020-01-03 MED ORDER — K PHOS MONO-SOD PHOS DI & MONO 155-852-130 MG PO TABS
500.0000 mg | ORAL_TABLET | ORAL | Status: DC
  Administered 2020-01-03: 10:00:00 500 mg via ORAL
  Filled 2020-01-03 (×2): qty 2

## 2020-01-03 MED ORDER — MAGNESIUM SULFATE 2 GM/50ML IV SOLN: 2.0000 g | Freq: Once | INTRAVENOUS | Status: DC

## 2020-01-03 MED ORDER — POTASSIUM CHLORIDE ER 20 MEQ PO TBCR: 40.0000 meq | EXTENDED_RELEASE_TABLET | Freq: Every day | ORAL | 0 refills | Status: DC

## 2020-01-03 MED ORDER — POTASSIUM PHOSPHATES 15 MMOLE/5ML IV SOLN
15.0000 mmol | Freq: Once | INTRAVENOUS | Status: DC
  Filled 2020-01-03: qty 5

## 2020-01-03 NOTE — Progress Notes (Signed)
[name], MD [address] [place], [place], [place], [address] [address], [place], [place], [place], [address] Phone: [phone]  Fax: [phone]   Subjective: Patient is sitting up in her chair at this time.  Denies complaints.   Objective: Exam: Vital signs in last 24 hours: Vitals:   01/02/20 1652 01/02/20 1945 01/03/20 0141 01/03/20 0400  BP: 122/66 122/68 122/74 (!) 141/75  Pulse: 67 78 70 72  Resp: 16 16 16 16   Temp: 97.9 F (36.6 C) 97.9 F (36.6 C) 98.2 F (36.8 C) 98.1 F (36.7 C)  TempSrc: Oral Oral Oral Oral  SpO2: 94% 95% 97% 97%  Weight:      Height:       Weight change:   Intake/Output Summary (Last 24 hours) at 01/03/2020 1042 Last data filed at 01/03/2020 0500 Gross per 24 hour  Intake 2574.4 ml  Output --  Net 2574.4 ml    Patient refused physical exam today and would like to go home  Lab Results: Lab Results  Component Value Date   WBC 7.0 01/01/2020   HGB 12.4 01/01/2020   HCT 35.9 (L) 01/01/2020   MCV 89.8 01/01/2020   PLT 221 01/01/2020   Micro Results: Recent Results (from the past 240 hour(s))  SARS CORONAVIRUS 2 (TAT 6-24 HRS) Nasopharyngeal Nasopharyngeal Swab     Status: None   Collection Time: 12/30/19  7:31 PM   Specimen: Nasopharyngeal Swab  Result Value Ref Range Status   SARS Coronavirus 2 NEGATIVE NEGATIVE Final    Comment: (NOTE) SARS-CoV-2 target nucleic acids are NOT DETECTED.  The SARS-CoV-2 RNA is generally detectable in upper and lower respiratory specimens during the acute phase of infection. Negative results do not preclude SARS-CoV-2 infection, do not rule out co-infections with other pathogens, and should not be used as the sole basis for treatment or other patient management decisions. Negative results must be combined with clinical observations, patient history, and epidemiological information. The expected result is Negative.  Fact Sheet for  Patients: SugarRoll.be  Fact Sheet for Healthcare Providers: https://www.woods-mathews.com/  This test is not yet approved or cleared by the [place] FDA and  has been authorized for detection and/or diagnosis of SARS-CoV-2 by FDA under an Emergency Use Authorization (EUA). This EUA will remain  in effect (meaning this test can be used) for the duration of the COVID-19 declaration under Se ction 564(b)(1) of the Act, 21 U.S.C. section 360bbb-3(b)(1), unless the authorization is terminated or revoked sooner.  Performed at [facility], [place] [address] [place], [place] [address]    Studies/Results: ECHOCARDIOGRAM COMPLETE  Result Date: 01/01/2020    ECHOCARDIOGRAM REPORT   Patient Name:   JACELYNN HAYTON Date of Exam: 01/01/2020 Medical Rec #:  [id number]           Height:       65.0 in Accession #:    [id number]          Weight:       317.5 lb Date of Birth:  08/24/1945           BSA:          2.407 m Patient Age:    74 years            BP:           144/82 mmHg Patient Gender: F                   HR:  67 bpm. Exam Location:  [facility] Procedure: 2D Echo, Cardiac Doppler and Color Doppler Indications:     Abnormal ECG 794.31  History:         Patient has no prior history of Echocardiogram examinations.                  Risk Factors:Hypertension.  Sonographer:     Sherrie Sport RDCS (AE) Referring Phys:  [id number] Kate Sable Diagnosing Phys: Kate Sable MD IMPRESSIONS  1. Left ventricular ejection fraction, by estimation, is 60 to 65%. The left ventricle has normal function. The left ventricle has no regional wall motion abnormalities. Left ventricular diastolic parameters are consistent with Grade I diastolic dysfunction (impaired relaxation).  2. Right ventricular systolic function is normal. The right ventricular size is normal.  3. The mitral valve is normal in structure. No evidence of mitral valve regurgitation. No evidence of  mitral stenosis.  4. The aortic valve is grossly normal. Aortic valve regurgitation is not visualized. No aortic stenosis is present. FINDINGS  Left Ventricle: Left ventricular ejection fraction, by estimation, is 60 to 65%. The left ventricle has normal function. The left ventricle has no regional wall motion abnormalities. The left ventricular internal cavity size was normal in size. There is  no left ventricular hypertrophy. Left ventricular diastolic parameters are consistent with Grade I diastolic dysfunction (impaired relaxation). Right Ventricle: The right ventricular size is normal. No increase in right ventricular wall thickness. Right ventricular systolic function is normal. Left Atrium: Left atrial size was normal in size. Right Atrium: Right atrial size was normal in size. Pericardium: There is no evidence of pericardial effusion. Mitral Valve: The mitral valve is normal in structure. Normal mobility of the mitral valve leaflets. No evidence of mitral valve regurgitation. No evidence of mitral valve stenosis. Tricuspid Valve: The tricuspid valve is grossly normal. Tricuspid valve regurgitation is not demonstrated. No evidence of tricuspid stenosis. Aortic Valve: The aortic valve is grossly normal. Aortic valve regurgitation is not visualized. No aortic stenosis is present. Aortic valve mean gradient measures 4.0 mmHg. Aortic valve peak gradient measures 7.5 mmHg. Aortic valve area, by VTI measures 2.74 cm. Pulmonic Valve: The pulmonic valve was not well visualized. Pulmonic valve regurgitation is not visualized. No evidence of pulmonic stenosis. Aorta: The aortic root is normal in size and structure. Venous: The inferior vena cava was not well visualized. IAS/Shunts: No atrial level shunt detected by color flow Doppler.  LEFT VENTRICLE PLAX 2D LVIDd:         4.37 cm  Diastology LVIDs:         2.41 cm  LV e' lateral:   7.07 cm/s LV PW:         1.55 cm  LV E/e' lateral: 10.4 LV IVS:        0.86 cm  LV e'  medial:    9.14 cm/s LVOT diam:     2.00 cm  LV E/e' medial:  8.0 LV SV:         84 LV SV Index:   35 LVOT Area:     3.14 cm  RIGHT VENTRICLE RV S prime:     14.40 cm/s TAPSE (M-mode): 1.3 cm LEFT ATRIUM           Index       RIGHT ATRIUM           Index LA diam:      3.50 cm 1.45 cm/m  RA Area:     18.80 cm  LA Vol (A2C): 46.3 ml 19.23 ml/m RA Volume:   53.70 ml  22.31 ml/m LA Vol (A4C): 43.7 ml 18.15 ml/m  AORTIC VALVE                   PULMONIC VALVE AV Area (Vmax):    2.80 cm    PV Vmax:        0.36 m/s AV Area (Vmean):   2.51 cm    PV Peak grad:   0.5 mmHg AV Area (VTI):     2.74 cm    RVOT Peak grad: 4 mmHg AV Vmax:           137.00 cm/s AV Vmean:          95.400 cm/s AV VTI:            0.306 m AV Peak Grad:      7.5 mmHg AV Mean Grad:      4.0 mmHg LVOT Vmax:         122.00 cm/s LVOT Vmean:        76.200 cm/s LVOT VTI:          0.267 m LVOT/AV VTI ratio: 0.87  AORTA Ao Root diam: 3.00 cm MITRAL VALVE               TRICUSPID VALVE MV Area (PHT): 2.83 cm    TR Peak grad:   44.6 mmHg MV Decel Time: 268 msec    TR Vmax:        334.00 cm/s MV E velocity: 73.20 cm/s MV A velocity: 96.50 cm/s  SHUNTS MV E/A ratio:  0.76        Systemic VTI:  0.27 m                            Systemic Diam: 2.00 cm Kate Sable MD Electronically signed by Kate Sable MD Signature Date/Time: 01/01/2020/5:43:49 PM    Final    Medications:  Scheduled Meds: . amLODipine  5 mg Oral Daily  . enoxaparin (LOVENOX) injection  40 mg Subcutaneous Q24H  . magnesium oxide  400 mg Oral BID  . oxyCODONE-acetaminophen  1 tablet Oral Daily  . phosphorus  500 mg Oral Q4H  . rosuvastatin  10 mg Oral QHS  . sodium chloride flush  3 mL Intravenous Q12H  . vitamin B-12  1,000 mcg Oral Daily   Continuous Infusions: . dextrose 5% lactated ringers with KCl 20 mEq/L 125 mL/hr at 01/03/20 0500   PRN Meds:.acetaminophen **OR** acetaminophen, ondansetron **OR** ondansetron (ZOFRAN) IV   Assessment: Principal Problem:    Hypokalemia Active Problems:   Hypomagnesemia   AKI (acute kidney injury) ([place])   Elevated troponin   Hypertension   Pre-op evaluation   Esophageal dysphagia    Plan: Patient's weight loss is likely due to her decreased appetite.  There are no upper GI lesions to allow her not to eat properly  Imaging also does not show any concerning lesions  However, she should follow-up in GI clinic as an outpatient and outpatient EUS on an elective basis can be considered after discussion with family and patient to follow up on the MRI findings  Liver enzymes normal  Pt is denying dysphagia or abdominal pain with food or liquids. No heartburn. Weight loss is likely due to her not eating enough. Loss of appetite does not have to be from any GI specific issues. Other etiologies like depression or others should  be evaluated by primary team  Follow up in GI clinic as an outpatient  GI service will sign off, please page with any questions   LOS: 3 days   [name], MD 01/03/2020, 10:42 AM

## 2020-01-03 NOTE — Progress Notes (Signed)
Pt removed IV. Refusal of new IV placement. States she is going home. Pt has called her granddaughter to come pick her up. Pt is agreeable at this point to staying until a ride is available.

## 2020-01-03 NOTE — Anesthesia Postprocedure Evaluation (Signed)
Anesthesia Post Note  Patient: Katie Caldwell  Procedure(s) Performed: ESOPHAGOGASTRODUODENOSCOPY (EGD) (N/A )  Patient location during evaluation: Endoscopy Anesthesia Type: General Level of consciousness: awake and alert Pain management: pain level controlled Vital Signs Assessment: post-procedure vital signs reviewed and stable Respiratory status: spontaneous breathing, nonlabored ventilation and respiratory function stable Cardiovascular status: blood pressure returned to baseline and stable Postop Assessment: no apparent nausea or vomiting Anesthetic complications: no   No complications documented.   Last Vitals:  Vitals:   01/03/20 0141 01/03/20 0400  BP: 122/74 (!) 141/75  Pulse: 70 72  Resp: 16 16  Temp: 36.8 C 36.7 C  SpO2: 97% 97%    Last Pain:  Vitals:   01/03/20 0940  TempSrc:   PainSc: 0-No pain                 Alphonsus Sias

## 2020-01-03 NOTE — Evaluation (Signed)
Occupational Therapy Evaluation Patient Details Name: Katie Caldwell MRN: [id number] DOB: 1945/09/04 Today's Date: 01/03/2020    History of Present Illness presented to ER secondary to progressive weakness, fatigue and SOB; admitted for management of hypokalemia, AKI   Clinical Impression   Pt presents this morning sitting in a standard chair with NT present. She lives at home with her husband and is eager to go home as soon as possible, requiring explanation for reason for evaluation and medical stay. Pt reports that she is independent in all ADL/IADL at home, still drives, and uses AD for ambulation intermittently. She has had 2 falls this year because she "tripped." he is alert and oriented, follows commands well and gave her husband to directions to [facility] over the phone. Pt is overall at reported baseline cognition and functional performance. Pt demonstrates equal bilateral UE/LE strength and ROM. She does not demonstrate any difficulties with balance, transfers or functional mobility impacting ADL performance during evaluation. Pt educated re: the role of OT in acute care, intended plan of care, falls prevention strategies and self-monitoring of health and well-being. No acute OT needs identified at this time. Safe for discharge home when medically appropriate. Will complete orders at this time; please re-consult should needs arise.       Follow Up Recommendations  No OT follow up    Equipment Recommendations  None recommended by OT    Recommendations for Other Services       Precautions / Restrictions Precautions Precautions: Fall Restrictions Weight Bearing Restrictions: No      Mobility Bed Mobility               General bed mobility comments: seated in standard chair beginning/end of treatment session  Transfers Overall transfer level: Independent Equipment used: None             General transfer comment: steady control, good LE strength/power    Balance  Overall balance assessment: Independent                                         ADL either performed or assessed with clinical judgement   ADL Overall ADL's : Independent;At baseline                                             Vision Baseline Vision/History: Wears glasses Wears Glasses: At all times Patient Visual Report: No change from baseline       Perception     Praxis      Pertinent Vitals/Pain Pain Assessment: No/denies pain     Hand Dominance     Extremity/Trunk Assessment Upper Extremity Assessment Upper Extremity Assessment: Overall WFL for tasks assessed (5/5 overall)   Lower Extremity Assessment Lower Extremity Assessment: Overall WFL for tasks assessed   Cervical / Trunk Assessment Cervical / Trunk Assessment: Normal   Communication Communication Communication: No difficulties   Cognition Arousal/Alertness: Awake/alert Behavior During Therapy: WFL for tasks assessed/performed Overall Cognitive Status: Within Functional Limits for tasks assessed                                     General Comments       Exercises Other Exercises Other  Exercises: Pt educated re: the role of OT in acute care, intended plan of care, falls prevention strategies and self-monitoring of health and well-being.   Shoulder Instructions      Home Living Family/patient expects to be discharged to:: Private residence Living Arrangements: Spouse/significant other Available Help at Discharge: Family Type of Home: House Home Access: Stairs to enter;Ramped entrance Entrance Stairs-Number of Steps: one entrance, 3-4 steps; additional entrance with ramp (most frequently utilized) Entrance Stairs-Rails: Right Home Layout: One level     Bathroom Shower/Tub: Tub/shower unit         Home Equipment: Environmental consultant - 4 wheels;Cane - single point          Prior Functioning/Environment Level of Independence: Independent         Comments: independent with ADL/IADL, uses AD intermittently; reports 2 falls wher she "tripped"; still drives        OT Problem List:        OT Treatment/Interventions:      OT Goals(Current goals can be found in the care plan section) Acute Rehab OT Goals Patient Stated Goal: to go home! OT Goal Formulation: All assessment and education complete, DC therapy  OT Frequency:     Barriers to D/C:            Co-evaluation              AM-PAC OT "6 Clicks" Daily Activity     Outcome Measure Help from another person eating meals?: None Help from another person taking care of personal grooming?: None Help from another person toileting, which includes using toliet, bedpan, or urinal?: None Help from another person bathing (including washing, rinsing, drying)?: None Help from another person to put on and taking off regular upper body clothing?: None Help from another person to put on and taking off regular lower body clothing?: None 6 Click Score: 24   End of Session Nurse Communication: Mobility status;Other (comment) (Pt requesting to leave AMA)  Activity Tolerance: Patient tolerated treatment well Patient left: in chair;with nursing/sitter in room  OT Visit Diagnosis: History of falling (Z91.81)                Time: 2257-5051 OT Time Calculation (min): 28 min Charges:  OT General Charges $OT Visit: 1 Visit OT Evaluation $OT Eval Low Complexity: 1 Low OT Treatments $Self Care/Home Management : 8-22 mins  Jerilynn Birkenhead, OTS 01/03/20, 10:56 AM

## 2020-01-03 NOTE — Care Management Important Message (Signed)
Important Message  Patient Details  Name: Katie Caldwell MRN: [id number] Date of Birth: Nov 08, 1945   Medicare Important Message Given:  Yes     Juliann Pulse A Wendy Hoback 01/03/2020, 10:41 AM

## 2020-01-03 NOTE — Progress Notes (Addendum)
Provided pt with AVS, Pt stated she didn't care and their was no evidence of learning. Called daughter [phone]  to review avs over phone with her no answer at time.

## 2020-01-03 NOTE — TOC Transition Note (Signed)
Transition of Care [facility]) - CM/SW Discharge Note   Patient Details  Name: Katie Caldwell MRN: [id number] Date of Birth: [birth year]  Transition of Care [facility]) CM/SW Contact:  Shelbie Hutching, RN Phone Number: 01/03/2020, 2:27 PM   Clinical Narrative:    Patient has been wanting to leave all morning to get back home.  MD discharged patient just now, patient is out the door with her husband, RN was able to get her DC paperwork to her before she left.  This RNCM has arranged home health services with [facility] for RN, PT, OT and [place] with [facility] notified of discharge.     Final next level of care: Home w Home Health Services Barriers to Discharge: Barriers Resolved   Patient Goals and CMS Choice Patient states their goals for this hospitalization and ongoing recovery are:: Daughter wound like some help for her mother at home- home health services and info on [facility] CMS Medicare.gov Compare Post Acute Care list provided to:: Patient Represenative (must comment) Choice offered to / list presented to : Adult Children [facility])  Discharge Placement                       Discharge Plan and Services   Discharge Planning Services: CM Consult Post Acute Care Choice: Home Health                    HH Arranged: RN, PT, OT, Social Work [facility] Agency: [place] Date [facility] Agency Contacted: 01/03/20 Time [place]: 33 Representative spoke with at [place]: [place] Determinants of Health ([place]) Interventions     Readmission Risk Interventions No flowsheet data found.

## 2020-01-03 NOTE — Evaluation (Signed)
Physical Therapy Evaluation Patient Details Name: Katie Caldwell MRN: [id number] DOB: 06-03-1946 Today's Date: 01/03/2020   History of Present Illness  presented to ER secondary to progressive weakness, fatigue and SOB; admitted for management of hypokalemia, AKI  Clinical Impression  Upon evaluation, patient alert and oriented to basic information; follows commands, generally focused on and eager for anticipated discharge.  Requires redirection to task at hand and reason for admission multiple times throughout session.  Bilat UE/LE strength and ROM grossly symmetrical and WFL; no focal weakness appreciated.  Able to complete sit/stand, basic transfers and gait (400') without assist device, mod indep.  Fair cadence and gait spee d(10' walk time, 10-11 seconds), indicative of household ambulator. Demonstrates reciprocal stepping pattern with fair step height/length; mild sway at times, but no overt buckling or LOB. Appears to be at baseline level of functional ability, mod indep without assist device.  No acute PT needs identified at this time.  Safe for discharge home when medically appropriate.  Will complete orders at this time; please re-consult should needs change.     Follow Up Recommendations No PT follow up    Equipment Recommendations       Recommendations for Other Services       Precautions / Restrictions Precautions Precautions: Fall Restrictions Weight Bearing Restrictions: No      Mobility  Bed Mobility               General bed mobility comments: seated in standard chair beginning/end of treatment session  Transfers Overall transfer level: Modified independent Equipment used: None             General transfer comment: good LE strength/power  Ambulation/Gait Ambulation/Gait assistance: Modified independent (Device/Increase time) Gait Distance (Feet): 400 Feet Assistive device: None   Gait velocity: 10' walk time, 10-11 seconds Gait velocity  interpretation: <1.31 ft/sec, indicative of household ambulator General Gait Details: reciprocal stepping pattern with fair step height/length; mild sway at times, but no overt buckling or LOB  Stairs            Wheelchair Mobility    Modified Rankin (Stroke Patients Only)       Balance Overall balance assessment: Modified Independent                                           Pertinent Vitals/Pain Pain Assessment: No/denies pain    Home Living Family/patient expects to be discharged to:: Private residence Living Arrangements: Spouse/significant other Available Help at Discharge: Family Type of Home: House Home Access: Stairs to enter;Ramped entrance   Entrance Stairs-Number of Steps: one entrance, 3-4 steps; additional entrance with ramp (most frequently utilized) Home Layout: One level Home Equipment: None      Prior Function Level of Independence: Independent         Comments: Indep with ADLs, household and community mobilization; denies fall history     Hand Dominance        Extremity/Trunk Assessment   Upper Extremity Assessment Upper Extremity Assessment: Overall WFL for tasks assessed (grossly at least 4+/5 throughout)    Lower Extremity Assessment Lower Extremity Assessment: Overall WFL for tasks assessed (grossly at least 4+/5 throughout)       Communication   Communication: No difficulties  Cognition Arousal/Alertness: Awake/alert Behavior During Therapy: WFL for tasks assessed/performed Overall Cognitive Status: Within Functional Limits for tasks assessed  General Comments      Exercises     Assessment/Plan    PT Assessment Patent does not need any further PT services  PT Problem List         PT Treatment Interventions      PT Goals (Current goals can be found in the Care Plan section)  Acute Rehab PT Goals Patient Stated Goal: to go home! PT Goal  Formulation: All assessment and education complete, DC therapy Time For Goal Achievement: 01/03/20 Potential to Achieve Goals: Good    Frequency     Barriers to discharge        Co-evaluation               AM-PAC PT "6 Clicks" Mobility  Outcome Measure Help needed turning from your back to your side while in a flat bed without using bedrails?: None Help needed moving from lying on your back to sitting on the side of a flat bed without using bedrails?: None Help needed moving to and from a bed to a chair (including a wheelchair)?: None Help needed standing up from a chair using your arms (e.g., wheelchair or bedside chair)?: None Help needed to walk in hospital room?: None Help needed climbing 3-5 steps with a railing? : None 6 Click Score: 24    End of Session Equipment Utilized During Treatment: Gait belt Activity Tolerance: Patient tolerated treatment well Patient left: in chair;with call bell/phone within reach (sitter in room) Nurse Communication: Mobility status PT Visit Diagnosis: Muscle weakness (generalized) (M62.81)    Time: 6503-5465 PT Time Calculation (min) (ACUTE ONLY): 10 min   Charges:   PT Evaluation $PT Eval Low Complexity: 1 Low         Kayen Grabel H. Owens Shark, PT, DPT, NCS 01/03/20, 9:32 AM [phone]

## 2020-01-03 NOTE — Progress Notes (Addendum)
MD states ok not to have IV as long as Blood pressure is stable however Pt refused vital signs [phone]- [phone] daughter updated pt is refusing care and wants to leave AMA.

## 2020-01-03 NOTE — Progress Notes (Signed)
[place] would like to speak speak about advance directive and ETOH rehab. Notified MD,  Placed spiral consult, and notified case worker for resources.

## 2020-01-03 NOTE — Consult Note (Signed)
PHARMACY CONSULT NOTE - FOLLOW UP  Pharmacy Consult for Electrolyte Monitoring and Replacement   Recent Labs: Potassium (mmol/L)  Date Value  01/03/2020 3.4 (L)  08/01/2014 3.5   Magnesium (mg/dL)  Date Value  01/03/2020 1.8   Calcium (mg/dL)  Date Value  01/03/2020 8.6 (L)   Calcium, Total (mg/dL)  Date Value  08/01/2014 9.1   Albumin (g/dL)  Date Value  01/03/2020 2.8 (L)  07/31/2014 3.3 (L)   Phosphorus (mg/dL)  Date Value  01/03/2020 2.4 (L)   Sodium (mmol/L)  Date Value  01/03/2020 138  08/01/2014 138   Assessment: Pharmacy consulted for electrolyte monitoring and replacement for 74 yo female admitted with hypokalemia and hypomagnesemia. Potassium on admission <2.  Per chart review, patient is not currently experiencing nausea or vomiting.  Goal of Therapy:  Cardiology recommends goal K of 4 prior to procedure. All other electrolytes WNL   Plan:  07/01 @0532  K 3.4  Mag 1.8  Phos 2.4 Scr 0.73 Patient has pulled out IV and refuses replacement of IV. MD aware and not planning to replace IV at this time-see RN note  Will replace w/ Potassium Phosphate tabs 500mg  PO q4h x 2 doses and  with Mag-ox PO 400mg  BID x 2 doses. will recheck all electrolytes this evening and w/ am labs and continue to monitor.  Noralee Space, PharmD Clinical Pharmacist 01/03/2020 7:45 AM

## 2020-01-03 NOTE — Discharge Summary (Addendum)
Physician Discharge Summary  Katie Caldwell BSW:[id number] DOB: [birth year] DOA: 12/30/2019  PCP: Rusty Aus, MD  Admit date: 12/30/2019 Discharge date: 01/03/2020  Admitted From: home Disposition:  home  Recommendations for Outpatient Follow-up:  1. Follow up with PCP in 1 week or less. 2. Please obtain BMP, Mg and Phos within one week, preferably early next week.   3. Patient had SEVERE hypokalemia, hypomagnesemia, required aggressive replacement and was discharged on K and Mg supplements. 4. Please follow up on patient's blood pressure.  Her lisinopril was held during admission and BP's were normal, so it was discontinued at discharge to prevent hypotension and falls.    Home Health: RN, PT, OT and SW Equipment/Devices: None   Discharge Condition: Stable  CODE STATUS: Full  Diet recommendation: Heart Healthy    Discharge Diagnoses: Principal Problem:   Hypokalemia Active Problems:   Hypomagnesemia   AKI (acute kidney injury) ([place])   Elevated troponin   Hypertension   Pre-op evaluation   Esophageal dysphagia    Summary of HPI and Hospital Course:  HPI and Hospital Course Summarized: Katie Caldwell is a 74 y.o. 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  Labs showed significant hypokalemia of 1.7, hypomagnesemia of 1.4, and new AKI with creatinine up to 1.5, prompting referral to the ED.   Daughter provided additional history upon admission.  She had come to visit from [place] and noticed significant weight loss since the last time she saw her mother.  Patient had reported to daughter that she has had no appetite, lost her taste for foods, progressive generalized weakness and  fatigue, and becomes easily tired and short of breath with walking short distances.  No abdominal pain, nausea, vomiting, diarrhea or other associated complaints.  Admitted to hospitalist service.  GI is consulted.    Hypokalemia / Hypomagnesemia Hypochloremia - POA, improved but requires significant daily replacement.   This is secondary to nutritional deficiency and very poor intake in setting of chronic daily alcohol use.  Required aggressive replacement of K and Mg daily.  Once electrolytes were stable enough to under sedation, patient underwent EGD which showed normal but tortuous esophagus, normal visualized duodenum.  GI recommended consideration of esophageal manometry as outpatient.  Patient was discharged with K 40 BID, MagOx 400 bid, and to see PCP for repeat labs early next week.  Patient and husband were counseled extensively on the importance of nutrition and dangers including fatal cardiac arrhythmias from severe electrolytes derangements.     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 She was frequently uncooperative with staff.  Required sitter for safety.  Required Seroquel and Ativan.  This overall improved and  Following nights were uneventful.  Likely combination of alcohol withdrawal and suspect mild underlying dementia.  Acute kidney injury - POA with Cr 1.61, improved with IV hydration.  Baseline creatinine approximately 0.7 (on 07/04/2019). Follow up BMP.  Abdominal Pain due to ?GERD vs PUD vs Gastritis - resolved, no pain today. Pt reported she was having significant abdominal pain whenever food would hit her stomach, but this no longer going on at time of admission, she attributes weight loss  to this problem.  Suspect alcohol-induced gastritis.  GI is consulted.  MRCP was poor quality due to motion artifact, but showed normal caliber common bile duct with "abrupt  truncation of the distal CBD".  GI plans for outpatient EUS to further evaluate this finding further.  No indication for ERCP at this time.  Cardiology cleared for EGD. EGD findings as above.  Elevated troponin - POA with hs-troponins 70 >> 64.  Patient without chest pain.  Suspect demand ischemia.  Downward trend in absence of chest pain, not consistent with ACS.    Essential hypertension - chronic, stable.  BP was soft on admission, amlodipine and lisinopril were held.  Resumed amlodipine.  Held lisinopril while renal function improved but did not resume on d/c as patient had normal BP's.  Chronic back pain - Continue home Percocet PRN.   Elevated anion gap - AG was 21 on admission, however now closed, due to electrolyte abnormalities.  Resolved.  Anorexia / Unintentional Weight Loss - Pt reported weight loss of approx 30 lbs over the last 1-2 months, due to "stomach issue"(however's issue is no longer ongoing).  Dietician consulted.  This is likely due to patient's daily alcohol use and not eating enough regular meals.  Further GI evaluation as outpatient discussed above.     Discharge Instructions   Discharge Instructions    Call MD for:  extreme fatigue   Complete by: As directed    Call MD for:  persistant dizziness or light-headedness   Complete by: As directed    Call MD for:  persistant nausea and vomiting   Complete by: As directed    Call MD for:  severe uncontrolled pain   Complete by: As directed    Call MD for:  temperature >100.4   Complete by: As directed    Diet - low sodium heart healthy   Complete by: As directed    Discharge instructions   Complete by: As directed    It is VERY IMPORTANT that you see your primary care doctor and have your labs checked early next week. I am sending potassium and magnesium for you to take at home, to keep these from dropping low.  Primary care doctor will decide if you need to continue taking these once they recheck your  labs.  PLEASE - avoid drinking any alcohol and be sure to eat regular meals at home.  If you aren't eating food regularly, your potassium and magnesium will drop low again.  As I explained this morning, potassium is critical for heart function and needs to be monitored closely as your level was dangerously low when you came in to the hospital.   Increase activity slowly   Complete by: As directed      Allergies as of 01/03/2020      Reactions   Micardis [telmisartan]       Medication List    STOP taking these medications   lisinopril 20 MG tablet Commonly known as: ZESTRIL     TAKE these medications   amLODipine 10 MG tablet Commonly known as: NORVASC Take 10 mg by mouth daily.   Cholecalciferol 50 MCG (2000 UT) Tabs Take 2,000 Units by mouth daily.   magnesium oxide 400 (241.3 Mg) MG tablet Commonly known as: MAG-OX Take 1 tablet (400 mg total) by mouth 2 (two) times daily.   oxyCODONE-acetaminophen 5-325 MG tablet Commonly known as: PERCOCET/ROXICET Take 1 tablet by mouth daily.   Potassium Chloride ER 20 MEQ Tbcr Take 40  mEq by mouth daily for 15 days.   rosuvastatin 10 MG tablet Commonly known as: CRESTOR Take 10 mg by mouth at bedtime.   Vitamin B 12 100 MCG Lozg Take 1,000 mcg by mouth daily.       Follow-up Information    Rusty Aus, MD. Schedule an appointment as soon as possible for a visit in 3 day(s).   Specialty: Internal Medicine Why: Patient needs very close follow up for labs, Mon/Tue/Wed of next week if possible. Contact information: [place] [address] [phone]              Allergies  Allergen Reactions  . Micardis [Telmisartan]     Consultations:  Gastroenterology   Procedures/Studies: MR ABDOMEN MRCP WO CONTRAST  Result Date: 01/01/2020 CLINICAL DATA:  Hypokalemia, low appetite, weight loss. EXAM: MRI ABDOMEN WITHOUT CONTRAST  (INCLUDING MRCP) TECHNIQUE:  Multiplanar multisequence MR imaging of the abdomen was performed. Heavily T2-weighted images of the biliary and pancreatic ducts were attempted, but the patient terminated the exam prior to acquisition. COMPARISON:  CT abdomen from 02/21/2013 FINDINGS: Despite efforts by the technologist and patient, motion artifact is present on today's exam and could not be eliminated. This reduces exam sensitivity and specificity. Lower chest: Unremarkable Hepatobiliary: Mild hepatic steatosis. Mild gallbladder wall thickening which may be related to the patient's hypoproteinemia/hypo but could also be seen in the setting inflammation. Probable sludge in the gallbladder. The common hepatic duct measures up to 0.9 cm, mildly prominent, with the common bile duct measuring up to 0.6 cm which is within normal limits. On image 19/3 there is a suggestion of abrupt truncation of the distal CBD which is less appreciable on the axial images this is of uncertain significance but makes it difficult to totally exclude the possibility of a small distal CBD stone. I not see a mass in the head of the pancreas or ampulla, with the understanding that today's exam was performed without IV contrast which limits diagnostic sensitivity and specificity. Pancreas:  Unremarkable Spleen: Low signal intensity in the spleen on diffusion-weighted images. This is nonspecific although can have an association with multiple myeloma. Adrenals/Urinary Tract: Fluid signal intensity 1.7 cm lesion of the right kidney upper pole favoring benign cyst. Adrenal glands unremarkable. Stomach/Bowel: Unremarkable Vascular/Lymphatic: No pathologic adenopathy. Abdominal aortic atherosclerotic vascular disease. Other:  No supplemental non-categorized findings. Musculoskeletal: Chronic Schmorl's node along the superior endplate of S1. Multilevel lumbar and thoracic degenerative endplate findings. IMPRESSION: 1. Despite efforts by the technologist and patient, motion artifact  is present on today's exam and could not be eliminated. This reduces exam sensitivity and specificity. 2. Mild hepatic steatosis. 3. Probable sludge in the gallbladder. Mild gallbladder wall thickening which may be related to the patient's hypoproteinemia/hypoproteinemia but could also be seen in the setting of inflammation. 4. The common hepatic duct measures up to 0.9 cm which is within normal limits. There is a suggestion of abrupt truncation of the distal CBD which is less appreciable on the axial images. This is of uncertain significance but makes it difficult to totally exclude the possibility of a small distal CBD stone. The patient terminated the exam prior to obtaining formal MRCP images. 5. Low signal intensity in the spleen on diffusion-weighted images. This is nonspecific although can have an association with multiple myeloma. Electronically Signed   By: Van Clines M.D.   On: 01/01/2020 07:50   MR 3D Recon At Scanner  Result Date: 01/01/2020 CLINICAL  DATA:  Hypokalemia, low appetite, weight loss. EXAM: MRI ABDOMEN WITHOUT CONTRAST  (INCLUDING MRCP) TECHNIQUE: Multiplanar multisequence MR imaging of the abdomen was performed. Heavily T2-weighted images of the biliary and pancreatic ducts were attempted, but the patient terminated the exam prior to acquisition. COMPARISON:  CT abdomen from 02/21/2013 FINDINGS: Despite efforts by the technologist and patient, motion artifact is present on today's exam and could not be eliminated. This reduces exam sensitivity and specificity. Lower chest: Unremarkable Hepatobiliary: Mild hepatic steatosis. Mild gallbladder wall thickening which may be related to the patient's hypoproteinemia/hypo but could also be seen in the setting inflammation. Probable sludge in the gallbladder. The common hepatic duct measures up to 0.9 cm, mildly prominent, with the common bile duct measuring up to 0.6 cm which is within normal limits. On image 19/3 there is a suggestion  of abrupt truncation of the distal CBD which is less appreciable on the axial images this is of uncertain significance but makes it difficult to totally exclude the possibility of a small distal CBD stone. I not see a mass in the head of the pancreas or ampulla, with the understanding that today's exam was performed without IV contrast which limits diagnostic sensitivity and specificity. Pancreas:  Unremarkable Spleen: Low signal intensity in the spleen on diffusion-weighted images. This is nonspecific although can have an association with multiple myeloma. Adrenals/Urinary Tract: Fluid signal intensity 1.7 cm lesion of the right kidney upper pole favoring benign cyst. Adrenal glands unremarkable. Stomach/Bowel: Unremarkable Vascular/Lymphatic: No pathologic adenopathy. Abdominal aortic atherosclerotic vascular disease. Other:  No supplemental non-categorized findings. Musculoskeletal: Chronic Schmorl's node along the superior endplate of S1. Multilevel lumbar and thoracic degenerative endplate findings. IMPRESSION: 1. Despite efforts by the technologist and patient, motion artifact is present on today's exam and could not be eliminated. This reduces exam sensitivity and specificity. 2. Mild hepatic steatosis. 3. Probable sludge in the gallbladder. Mild gallbladder wall thickening which may be related to the patient's hypoproteinemia/hypoproteinemia but could also be seen in the setting of inflammation. 4. The common hepatic duct measures up to 0.9 cm which is within normal limits. There is a suggestion of abrupt truncation of the distal CBD which is less appreciable on the axial images. This is of uncertain significance but makes it difficult to totally exclude the possibility of a small distal CBD stone. The patient terminated the exam prior to obtaining formal MRCP images. 5. Low signal intensity in the spleen on diffusion-weighted images. This is nonspecific although can have an association with multiple  myeloma. Electronically Signed   By: Van Clines M.D.   On: 01/01/2020 07:50   ECHOCARDIOGRAM COMPLETE  Result Date: 01/01/2020    ECHOCARDIOGRAM REPORT   Patient Name:   Katie Caldwell Date of Exam: 01/01/2020 Medical Rec #:  [id number]           Height:       65.0 in Accession #:    [id number]          Weight:       317.5 lb Date of Birth:  [birth year]           BSA:          2.407 m Patient Age:    74 years            BP:           144/82 mmHg Patient Gender: F  HR:           67 bpm. Exam Location:  [facility] Procedure: 2D Echo, Cardiac Doppler and Color Doppler Indications:     Abnormal ECG 794.31  History:         Patient has no prior history of Echocardiogram examinations.                  Risk Factors:Hypertension.  Sonographer:     Sherrie Sport RDCS (AE) Referring Phys:  [id number] Kate Sable Diagnosing Phys: Kate Sable MD IMPRESSIONS  1. Left ventricular ejection fraction, by estimation, is 60 to 65%. The left ventricle has normal function. The left ventricle has no regional wall motion abnormalities. Left ventricular diastolic parameters are consistent with Grade I diastolic dysfunction (impaired relaxation).  2. Right ventricular systolic function is normal. The right ventricular size is normal.  3. The mitral valve is normal in structure. No evidence of mitral valve regurgitation. No evidence of mitral stenosis.  4. The aortic valve is grossly normal. Aortic valve regurgitation is not visualized. No aortic stenosis is present. FINDINGS  Left Ventricle: Left ventricular ejection fraction, by estimation, is 60 to 65%. The left ventricle has normal function. The left ventricle has no regional wall motion abnormalities. The left ventricular internal cavity size was normal in size. There is  no left ventricular hypertrophy. Left ventricular diastolic parameters are consistent with Grade I diastolic dysfunction (impaired relaxation). Right Ventricle: The right ventricular size  is normal. No increase in right ventricular wall thickness. Right ventricular systolic function is normal. Left Atrium: Left atrial size was normal in size. Right Atrium: Right atrial size was normal in size. Pericardium: There is no evidence of pericardial effusion. Mitral Valve: The mitral valve is normal in structure. Normal mobility of the mitral valve leaflets. No evidence of mitral valve regurgitation. No evidence of mitral valve stenosis. Tricuspid Valve: The tricuspid valve is grossly normal. Tricuspid valve regurgitation is not demonstrated. No evidence of tricuspid stenosis. Aortic Valve: The aortic valve is grossly normal. Aortic valve regurgitation is not visualized. No aortic stenosis is present. Aortic valve mean gradient measures 4.0 mmHg. Aortic valve peak gradient measures 7.5 mmHg. Aortic valve area, by VTI measures 2.74 cm. Pulmonic Valve: The pulmonic valve was not well visualized. Pulmonic valve regurgitation is not visualized. No evidence of pulmonic stenosis. Aorta: The aortic root is normal in size and structure. Venous: The inferior vena cava was not well visualized. IAS/Shunts: No atrial level shunt detected by color flow Doppler.  LEFT VENTRICLE PLAX 2D LVIDd:         4.37 cm  Diastology LVIDs:         2.41 cm  LV e' lateral:   7.07 cm/s LV PW:         1.55 cm  LV E/e' lateral: 10.4 LV IVS:        0.86 cm  LV e' medial:    9.14 cm/s LVOT diam:     2.00 cm  LV E/e' medial:  8.0 LV SV:         84 LV SV Index:   35 LVOT Area:     3.14 cm  RIGHT VENTRICLE RV S prime:     14.40 cm/s TAPSE (M-mode): 1.3 cm LEFT ATRIUM           Index       RIGHT ATRIUM           Index LA diam:      3.50 cm 1.45  cm/m  RA Area:     18.80 cm LA Vol (A2C): 46.3 ml 19.23 ml/m RA Volume:   53.70 ml  22.31 ml/m LA Vol (A4C): 43.7 ml 18.15 ml/m  AORTIC VALVE                   PULMONIC VALVE AV Area (Vmax):    2.80 cm    PV Vmax:        0.36 m/s AV Area (Vmean):   2.51 cm    PV Peak grad:   0.5 mmHg AV Area  (VTI):     2.74 cm    RVOT Peak grad: 4 mmHg AV Vmax:           137.00 cm/s AV Vmean:          95.400 cm/s AV VTI:            0.306 m AV Peak Grad:      7.5 mmHg AV Mean Grad:      4.0 mmHg LVOT Vmax:         122.00 cm/s LVOT Vmean:        76.200 cm/s LVOT VTI:          0.267 m LVOT/AV VTI ratio: 0.87  AORTA Ao Root diam: 3.00 cm MITRAL VALVE               TRICUSPID VALVE MV Area (PHT): 2.83 cm    TR Peak grad:   44.6 mmHg MV Decel Time: 268 msec    TR Vmax:        334.00 cm/s MV E velocity: 73.20 cm/s MV A velocity: 96.50 cm/s  SHUNTS MV E/A ratio:  0.76        Systemic VTI:  0.27 m                            Systemic Diam: 2.00 cm Kate Sable MD Electronically signed by Kate Sable MD Signature Date/Time: 01/01/2020/5:43:49 PM    Final       EGD 01/02/2020:   Findings per op note:      The examined esophagus was normal.      The distal esophagus was tortuous.      The entire examined stomach was normal.      The duodenal bulb, second portion of the duodenum and examined duodenum were normal. Impression:    Normal esophagus. Tortuous esophagus. Normal stomach. Normal duodenal bulb, second portion of the duodenum and examined duodenum. No specimens collected.   Subjective: Patient was dressed in her clothes and shoes trying to leave before rounding on her this morning.  She was refusing to wait for her daughter to return (had to go to hotel briefly to move rooms).  She said they had to "make a run before the store closes", but would not say what store.  She was alert and fully oriented.  Husband was also at bedside.  I counseled them both again about importance of nutrition and avoiding /minimizing alcohol.  She says she doesn't drink but 1 or 2 shots a day.  Again explained how dangerously low her potassium and the risk of death.  She stood up to leave and did not want to listen.    Spoke to patient's daughter by phone.  She wants patient to go to substance abuse rehab, but patient will  not consider.  Daughter exploring options for guardianship as she is very concerened the patient  and her husband are not taking care of themselves.   Discharge Exam: Vitals:   01/03/20 0141 01/03/20 0400  BP: 122/74 (!) 141/75  Pulse: 70 72  Resp: 16 16  Temp: 98.2 F (36.8 C) 98.1 F (36.7 C)  SpO2: 97% 97%   Vitals:   01/02/20 1652 01/02/20 1945 01/03/20 0141 01/03/20 0400  BP: 122/66 122/68 122/74 (!) 141/75  Pulse: 67 78 70 72  Resp: 16 16 16 16   Temp: 97.9 F (36.6 C) 97.9 F (36.6 C) 98.2 F (36.8 C) 98.1 F (36.7 C)  TempSrc: Oral Oral Oral Oral  SpO2: 94% 95% 97% 97%  Weight:      Height:        General: Pt is alert, awake, not in acute distress Cardiovascular: RRR, S1/S2 +, no rubs, no gallops Respiratory: CTA bilaterally, no wheezing, no rhonchi Abdominal: Soft, NT, ND, bowel sounds + Extremities: no edema, no cyanosis    The results of significant diagnostics from this hospitalization (including imaging, microbiology, ancillary and laboratory) are listed below for reference.     Microbiology: Recent Results (from the past 240 hour(s))  SARS CORONAVIRUS 2 (TAT 6-24 HRS) Nasopharyngeal Nasopharyngeal Swab     Status: None   Collection Time: 12/30/19  7:31 PM   Specimen: Nasopharyngeal Swab  Result Value Ref Range Status   SARS Coronavirus 2 NEGATIVE NEGATIVE Final    Comment: (NOTE) SARS-CoV-2 target nucleic acids are NOT DETECTED.  The SARS-CoV-2 RNA is generally detectable in upper and lower respiratory specimens during the acute phase of infection. Negative results do not preclude SARS-CoV-2 infection, do not rule out co-infections with other pathogens, and should not be used as the sole basis for treatment or other patient management decisions. Negative results must be combined with clinical observations, patient history, and epidemiological information. The expected result is Negative.  Fact Sheet for  Patients: SugarRoll.be  Fact Sheet for Healthcare Providers: https://www.woods-mathews.com/  This test is not yet approved or cleared by the [place] FDA and  has been authorized for detection and/or diagnosis of SARS-CoV-2 by FDA under an Emergency Use Authorization (EUA). This EUA will remain  in effect (meaning this test can be used) for the duration of the COVID-19 declaration under Se ction 564(b)(1) of the Act, 21 U.S.C. section 360bbb-3(b)(1), unless the authorization is terminated or revoked sooner.  Performed at [facility], [place] [address], [place], [place] [address]      Labs: BNP (last 3 results) No results for input(s): BNP in the last 8760 hours. Basic Metabolic Panel: Recent Labs  Lab 12/31/19 0642 12/31/19 0642 12/31/19 1713 01/01/20 1109 01/01/20 1109 01/01/20 2248 01/01/20 2248 01/02/20 0626 01/02/20 1202 01/02/20 2059 01/03/20 0532 01/03/20 1305  NA 137  --  140 142  --   --   --  142  --   --  138  --   K 2.1*   < > 2.2* 2.8*   < > 2.7*   < > 2.8* 3.0* 3.0* 3.4* 3.3*  CL 87*  --  89* 92*  --   --   --  93*  --   --  96*  --   CO2 36*  --  35* 37*  --   --   --  35*  --   --  31  --   GLUCOSE 123*  --  142* 112*  --   --   --  101*  --   --  103*  --  BUN 19  --  15 9  --   --   --  <5*  --   --  <5*  --   CREATININE 1.21*  --  1.18* 0.92  --   --   --  0.75  --   --  0.73  --   CALCIUM 8.8*  --  8.9 8.8*  --   --   --  8.6*  --   --  8.6*  --   MG 2.0   < >  --  1.3*   < > 1.8  --  1.5* 1.4*  --  1.8 1.8  PHOS 1.9*  --   --  2.5  --   --   --   --   --   --  2.4*  --    < > = values in this interval not displayed.   Liver Function Tests: Recent Labs  Lab 12/31/19 1713 01/01/20 1109 01/02/20 0626 01/03/20 0532  AST 50* 45*  44* 36 35  ALT 16 15  15 13 13   ALKPHOS 62 59  59 58 58  BILITOT 1.3* 0.8  0.8 0.8 0.8  PROT 6.4* 6.2*  6.2* 6.0* 6.4*  ALBUMIN 2.9* 2.8*  2.8* 2.8* 2.8*    No results for input(s): LIPASE, AMYLASE in the last 168 hours. No results for input(s): AMMONIA in the last 168 hours. CBC: Recent Labs  Lab 12/30/19 1830 12/31/19 0642 01/01/20 1109  WBC 8.5 7.0 7.0  NEUTROABS 5.2  --   --   HGB 12.6 12.7 12.4  HCT 35.7* 36.2 35.9*  MCV 88.1 88.5 89.8  PLT 240 231 221   Cardiac Enzymes: No results for input(s): CKTOTAL, CKMB, CKMBINDEX, TROPONINI in the last 168 hours. BNP: Invalid input(s): POCBNP CBG: No results for input(s): GLUCAP in the last 168 hours. D-Dimer No results for input(s): DDIMER in the last 72 hours. Hgb A1c No results for input(s): HGBA1C in the last 72 hours. Lipid Profile No results for input(s): CHOL, HDL, LDLCALC, TRIG, CHOLHDL, LDLDIRECT in the last 72 hours. Thyroid function studies No results for input(s): TSH, T4TOTAL, T3FREE, THYROIDAB in the last 72 hours.  Invalid input(s): FREET3 Anemia work up No results for input(s): VITAMINB12, FOLATE, FERRITIN, TIBC, IRON, RETICCTPCT in the last 72 hours. Urinalysis    Component Value Date/Time   COLORURINE Straw 07/31/2014 1107   APPEARANCEUR Clear 07/31/2014 1107   LABSPEC 1.006 07/31/2014 1107   PHURINE 6.0 07/31/2014 1107   GLUCOSEU Negative 07/31/2014 1107   HGBUR Negative 07/31/2014 1107   BILIRUBINUR Negative 07/31/2014 1107   KETONESUR Negative 07/31/2014 1107   PROTEINUR Negative 07/31/2014 1107   NITRITE Negative 07/31/2014 1107   LEUKOCYTESUR Negative 07/31/2014 1107   Sepsis Labs Invalid input(s): PROCALCITONIN,  WBC,  LACTICIDVEN Microbiology Recent Results (from the past 240 hour(s))  SARS CORONAVIRUS 2 (TAT 6-24 HRS) Nasopharyngeal Nasopharyngeal Swab     Status: None   Collection Time: 12/30/19  7:31 PM   Specimen: Nasopharyngeal Swab  Result Value Ref Range Status   SARS Coronavirus 2 NEGATIVE NEGATIVE Final    Comment: (NOTE) SARS-CoV-2 target nucleic acids are NOT DETECTED.  The SARS-CoV-2 RNA is generally detectable in upper and  lower respiratory specimens during the acute phase of infection. Negative results do not preclude SARS-CoV-2 infection, do not rule out co-infections with other pathogens, and should not be used as the sole basis for treatment or other patient management decisions. Negative results must be combined with  clinical observations, patient history, and epidemiological information. The expected result is Negative.  Fact Sheet for Patients: SugarRoll.be  Fact Sheet for Healthcare Providers: https://www.woods-mathews.com/  This test is not yet approved or cleared by the [place] FDA and  has been authorized for detection and/or diagnosis of SARS-CoV-2 by FDA under an Emergency Use Authorization (EUA). This EUA will remain  in effect (meaning this test can be used) for the duration of the COVID-19 declaration under Se ction 564(b)(1) of the Act, 21 U.S.C. section 360bbb-3(b)(1), unless the authorization is terminated or revoked sooner.  Performed at [facility], [place] [address], [place], [place] [address]      Time coordinating discharge: Over 30 minutes  SIGNED:   Ezekiel Slocumb, DO [facility] 01/03/2020, 1:44 PM   If 7PM-7AM, please contact night-coverage [email or web address]

## 2020-01-22 DIAGNOSIS — F1010 Alcohol abuse, uncomplicated: Secondary | ICD-10-CM | POA: Insufficient documentation

## 2020-01-22 DIAGNOSIS — F1190 Opioid use, unspecified, uncomplicated: Secondary | ICD-10-CM | POA: Insufficient documentation

## 2020-01-23 ENCOUNTER — Ambulatory Visit: Payer: Medicare PPO | Admitting: Gastroenterology

## 2020-01-23 ENCOUNTER — Other Ambulatory Visit: Payer: Self-pay

## 2020-03-20 ENCOUNTER — Other Ambulatory Visit: Payer: Self-pay | Admitting: Internal Medicine

## 2020-03-20 ENCOUNTER — Ambulatory Visit
Admission: RE | Admit: 2020-03-20 | Discharge: 2020-03-20 | Disposition: A | Discharge status: Home / Self Care | Payer: Medicare PPO | Source: Ambulatory Visit | Attending: Internal Medicine | Admitting: Internal Medicine

## 2020-03-20 ENCOUNTER — Other Ambulatory Visit: Payer: Self-pay

## 2020-03-20 DIAGNOSIS — I824Z2 Acute embolism and thrombosis of unspecified deep veins of left distal lower extremity: Secondary | ICD-10-CM | POA: Insufficient documentation

## 2020-03-27 ENCOUNTER — Other Ambulatory Visit: Payer: Self-pay | Admitting: Internal Medicine

## 2020-03-27 DIAGNOSIS — R1084 Generalized abdominal pain: Secondary | ICD-10-CM

## 2020-03-27 DIAGNOSIS — R634 Abnormal weight loss: Secondary | ICD-10-CM

## 2020-04-08 ENCOUNTER — Other Ambulatory Visit: Payer: Self-pay

## 2020-04-08 ENCOUNTER — Ambulatory Visit
Admission: RE | Admit: 2020-04-08 | Discharge: 2020-04-08 | Disposition: A | Discharge status: Home / Self Care | Payer: Medicare PPO | Source: Ambulatory Visit | Attending: Internal Medicine | Admitting: Internal Medicine

## 2020-04-08 DIAGNOSIS — R634 Abnormal weight loss: Secondary | ICD-10-CM | POA: Diagnosis present

## 2020-04-08 DIAGNOSIS — R1084 Generalized abdominal pain: Secondary | ICD-10-CM | POA: Insufficient documentation

## 2020-04-08 MED ORDER — IOHEXOL 300 MG/ML  SOLN
75.0000 mL | Freq: Once | INTRAMUSCULAR | Status: AC | PRN
  Administered 2020-04-08: 75 mL via INTRAVENOUS

## 2020-04-08 MED ORDER — IOHEXOL 350 MG/ML SOLN: 75.0000 mL | Freq: Once | INTRAVENOUS | Status: DC | PRN

## 2020-10-29 ENCOUNTER — Emergency Department: Payer: Medicare PPO

## 2020-10-29 ENCOUNTER — Emergency Department
Admission: EM | Admit: 2020-10-29 | Discharge: 2020-10-29 | Disposition: A | Discharge status: Home / Self Care | Payer: Medicare PPO | Attending: Emergency Medicine | Admitting: Emergency Medicine

## 2020-10-29 DIAGNOSIS — R410 Disorientation, unspecified: Secondary | ICD-10-CM | POA: Diagnosis not present

## 2020-10-29 DIAGNOSIS — I10 Essential (primary) hypertension: Secondary | ICD-10-CM | POA: Insufficient documentation

## 2020-10-29 DIAGNOSIS — W19XXXA Unspecified fall, initial encounter: Secondary | ICD-10-CM

## 2020-10-29 DIAGNOSIS — Y906 Blood alcohol level of 120-199 mg/100 ml: Secondary | ICD-10-CM | POA: Insufficient documentation

## 2020-10-29 DIAGNOSIS — F10920 Alcohol use, unspecified with intoxication, uncomplicated: Secondary | ICD-10-CM

## 2020-10-29 DIAGNOSIS — F17220 Nicotine dependence, chewing tobacco, uncomplicated: Secondary | ICD-10-CM | POA: Diagnosis not present

## 2020-10-29 DIAGNOSIS — Y92009 Unspecified place in unspecified non-institutional (private) residence as the place of occurrence of the external cause: Secondary | ICD-10-CM | POA: Diagnosis not present

## 2020-10-29 DIAGNOSIS — W010XXA Fall on same level from slipping, tripping and stumbling without subsequent striking against object, initial encounter: Secondary | ICD-10-CM | POA: Insufficient documentation

## 2020-10-29 DIAGNOSIS — Z79899 Other long term (current) drug therapy: Secondary | ICD-10-CM | POA: Insufficient documentation

## 2020-10-29 DIAGNOSIS — F10929 Alcohol use, unspecified with intoxication, unspecified: Secondary | ICD-10-CM | POA: Insufficient documentation

## 2020-10-29 DIAGNOSIS — Y93E5 Activity, floor mopping and cleaning: Secondary | ICD-10-CM | POA: Insufficient documentation

## 2020-10-29 DIAGNOSIS — M542 Cervicalgia: Secondary | ICD-10-CM | POA: Diagnosis present

## 2020-10-29 LAB — CBC WITH DIFFERENTIAL/PLATELET
Abs Immature Granulocytes: 0.03 10*3/uL (ref 0.00–0.07)
Basophils Absolute: 0.1 10*3/uL (ref 0.0–0.1)
Basophils Relative: 1 %
Eosinophils Absolute: 0.1 10*3/uL (ref 0.0–0.5)
Eosinophils Relative: 1 %
HCT: 50.4 % — ABNORMAL HIGH (ref 36.0–46.0)
Hemoglobin: 16.6 g/dL — ABNORMAL HIGH (ref 12.0–15.0)
Immature Granulocytes: 0 %
Lymphocytes Relative: 31 %
Lymphs Abs: 3.2 10*3/uL (ref 0.7–4.0)
MCH: 29.3 pg (ref 26.0–34.0)
MCHC: 32.9 g/dL (ref 30.0–36.0)
MCV: 88.9 fL (ref 80.0–100.0)
Monocytes Absolute: 0.5 10*3/uL (ref 0.1–1.0)
Monocytes Relative: 5 %
Neutro Abs: 6.3 10*3/uL (ref 1.7–7.7)
Neutrophils Relative %: 62 %
Platelets: 284 10*3/uL (ref 150–400)
RBC: 5.67 MIL/uL — ABNORMAL HIGH (ref 3.87–5.11)
RDW: 15.2 % (ref 11.5–15.5)
WBC: 10.1 10*3/uL (ref 4.0–10.5)
nRBC: 0 % (ref 0.0–0.2)

## 2020-10-29 LAB — COMPREHENSIVE METABOLIC PANEL
ALT: 14 U/L (ref 0–44)
AST: 33 U/L (ref 15–41)
Albumin: 3.6 g/dL (ref 3.5–5.0)
Alkaline Phosphatase: 54 U/L (ref 38–126)
Anion gap: 16 — ABNORMAL HIGH (ref 5–15)
BUN: 20 mg/dL (ref 8–23)
CO2: 21 mmol/L — ABNORMAL LOW (ref 22–32)
Calcium: 9.2 mg/dL (ref 8.9–10.3)
Chloride: 105 mmol/L (ref 98–111)
Creatinine, Ser: 0.53 mg/dL (ref 0.44–1.00)
GFR, Estimated: >60 mL/min (ref >60)
Glucose, Bld: 70 mg/dL (ref 70–99)
Potassium: 4 mmol/L (ref 3.5–5.1)
Sodium: 142 mmol/L (ref 135–145)
Total Bilirubin: 1 mg/dL (ref 0.3–1.2)
Total Protein: 7.1 g/dL (ref 6.5–8.1)

## 2020-10-29 LAB — TROPONIN I (HIGH SENSITIVITY): Troponin I (High Sensitivity): 3 ng/L (ref <18)

## 2020-10-29 LAB — ETHANOL: Alcohol, Ethyl (B): 133 mg/dL — ABNORMAL HIGH (ref <10)

## 2020-10-29 NOTE — ED Provider Notes (Signed)
[facility] Emergency Department Provider Note   ____________________________________________   Event Date/Time   First MD Initiated Contact with Patient 10/29/20 1150     (approximate)  I have reviewed the triage vital signs and the nursing notes.   HISTORY  Chief Complaint Fall    HPI ALSHA MELAND is a 75 y.o. female with past medical history of hypertension, DVT on Eliquis, and alcohol abuse who presents to the ED for fall.  Patient states that she was cleaning in her house this morning and lost her balance while trying to use her walker.  She is unsure whether she hit her head, but she denies losing consciousness.  She denies any headache, numbness, or weakness but does endorse some pain at the base of her neck.  She denies any pain in her hips or extremities.  Her husband was apparently brought to the ED for fall earlier today, patient was then found slightly disoriented by her granddaughter, who called EMS.        Past Medical History:  Diagnosis Date  . Hypertension   . Melanoma of foot, right ([place]) 07/11/2019    Patient Active Problem List   Diagnosis Date Noted  . Alcohol abuse, daily use 01/22/2020  . Opiate use 01/22/2020  . Esophageal dysphagia   . Pre-op evaluation   . Hypokalemia 12/30/2019  . Hypomagnesemia 12/30/2019  . AKI (acute kidney injury) ([place]) 12/30/2019  . Elevated troponin 12/30/2019  . Hypertension   . Melanoma of foot, right ([place]) 07/11/2019  . Vitamin D deficiency 07/11/2019  . Hyperlipidemia, mixed 12/05/2016  . B12 deficiency 12/25/2015  . Lumbar disc disease 12/03/2013  . Vasovagal syncope 10/13/2013    Past Surgical History:  Procedure Laterality Date  . ABDOMINAL HYSTERECTOMY    . bladder surgery    . COLONOSCOPY WITH PROPOFOL N/A 02/01/2016   Procedure: COLONOSCOPY WITH PROPOFOL;  Surgeon: Lollie Sails, MD;  Location: [facility] ENDOSCOPY;  Service: Endoscopy;  Laterality: N/A;  .  ESOPHAGOGASTRODUODENOSCOPY N/A 01/02/2020   Procedure: ESOPHAGOGASTRODUODENOSCOPY (EGD);  Surgeon: Virgel Manifold, MD;  Location: [facility] ENDOSCOPY;  Service: Endoscopy;  Laterality: N/A;  . TONSILLECTOMY      Prior to Admission medications   Medication Sig Start Date End Date Taking? Authorizing Provider  amLODipine (NORVASC) 10 MG tablet Take 10 mg by mouth daily.    [provider]  Cholecalciferol 2000 units TABS Take 2,000 Units by mouth daily.     [provider]  Cyanocobalamin (VITAMIN B 12) 100 MCG LOZG Take 1,000 mcg by mouth daily.     [provider]  magnesium oxide (MAG-OX) 400 (241.3 Mg) MG tablet Take 1 tablet (400 mg total) by mouth 2 (two) times daily. 01/03/20   Ezekiel Slocumb, DO  oxyCODONE-acetaminophen (PERCOCET/ROXICET) 5-325 MG tablet Take 1 tablet by mouth daily.     [provider]  potassium chloride 20 MEQ TBCR Take 40 mEq by mouth 2 (two) times daily for 15 days. 01/03/20 01/18/20  Nicole Kindred A, DO  rosuvastatin (CRESTOR) 10 MG tablet Take 10 mg by mouth at bedtime. 12/24/19   [provider]    Allergies Micardis [telmisartan]  Family History  Problem Relation Age of Onset  . Breast cancer Sister 60    Social History Social History   Tobacco Use  . Smoking status: Former Research scientist (life sciences)  . Smokeless tobacco: Current User  Substance Use Topics  . Alcohol use: Not Currently  . Drug use: Never  Review of Systems  Constitutional: No fever/chills.  Positive for generalized weakness. Eyes: No visual changes. ENT: No sore throat. Cardiovascular: Denies chest pain. Respiratory: Denies shortness of breath. Gastrointestinal: No abdominal pain.  No nausea, no vomiting.  No diarrhea.  No constipation. Genitourinary: Negative for dysuria. Musculoskeletal: Negative for back pain.  Positive for neck pain. Skin: Negative for rash. Neurological: Negative for headaches, focal weakness or  numbness.  ____________________________________________   PHYSICAL EXAM:  VITAL SIGNS: ED Triage Vitals [10/29/20 1150]  Enc Vitals Group     BP      Pulse      Resp      Temp      Temp src      SpO2      Weight 160 lb (72.6 kg)     Height 5\' 6"  (1.676 m)     Head Circumference      Peak Flow      Pain Score 0     Pain Loc      Pain Edu?      Excl. in [place]?     Constitutional: Alert and oriented x3. Eyes: Conjunctivae are normal. Head: Atraumatic. Nose: No congestion/rhinnorhea. Mouth/Throat: Mucous membranes are moist. Neck: Normal ROM, midline cervical spine tenderness to palpation noted. Cardiovascular: Normal rate, regular rhythm. Grossly normal heart sounds. Respiratory: Normal respiratory effort.  No retractions. Lungs CTAB. Gastrointestinal: Soft and nontender. No distention. Genitourinary: deferred Musculoskeletal: No lower extremity tenderness nor edema. Neurologic:  Normal speech and language. No gross focal neurologic deficits are appreciated. Skin:  Skin is warm, dry and intact. No rash noted. Psychiatric: Mood and affect are normal. Speech and behavior are normal.  ____________________________________________   LABS (all labs ordered are listed, but only abnormal results are displayed)  Labs Reviewed  CBC WITH DIFFERENTIAL/PLATELET - Abnormal; Notable for the following components:      Result Value   RBC 5.67 (*)    Hemoglobin 16.6 (*)    HCT 50.4 (*)    All other components within normal limits  ETHANOL - Abnormal; Notable for the following components:   Alcohol, Ethyl (B) 133 (*)    All other components within normal limits  COMPREHENSIVE METABOLIC PANEL - Abnormal; Notable for the following components:   CO2 21 (*)    Anion gap 16 (*)    All other components within normal limits  URINALYSIS, COMPLETE (UACMP) WITH MICROSCOPIC  TROPONIN I (HIGH SENSITIVITY)   ____________________________________________  EKG  ED ECG REPORT I, Blake Divine, the attending physician, personally viewed and interpreted this ECG.   Date: 10/29/2020  EKG Time: 12:56  Rate: 79  Rhythm: normal sinus rhythm  Axis: Normal  Intervals:none  ST&T Change: None   PROCEDURES  Procedure(s) performed (including Critical Care):  Procedures   ____________________________________________   INITIAL IMPRESSION / ASSESSMENT AND PLAN / ED COURSE       75 year old female with past medical history of hypertension, DVT on Eliquis, and alcohol abuse who presents to the ED following unwitnessed fall.  Given it is unclear whether she hit her head and she is anticoagulated, we will check CT head and cervical spine.  She has no signs of trauma to her hips or extremities.  Given some mild confusion with unclear baseline, we will check EKG, labs, and UA.  CT head reviewed by me with no obvious hemorrhage, CT head and cervical spine negative for acute process per radiology.  Lab work is remarkable only for mildly elevated blood alcohol, patient  mitts to drinking "Bacardi and Pepsi" this morning.  She now appears clinically sober with no ongoing confusion.  I spoke with her daughter over the phone, who will send patient's granddaughter to pick her up and take her home.  Granddaughter will stay with her until the daughter arrives from [place] tomorrow to figure out long-term living arrangements.  Patient agrees with plan.      ____________________________________________   FINAL CLINICAL IMPRESSION(S) / ED DIAGNOSES  Final diagnoses:  Fall, initial encounter  Alcoholic intoxication without complication [facility])     ED Discharge Orders    None       Note:  This document was prepared using Dragon voice recognition software and may include unintentional dictation errors.   Blake Divine, MD 10/29/20 1432

## 2020-10-29 NOTE — ED Notes (Addendum)
Pt has been provided with discharge instructions. Pt denies any questions or concerns at this time. Pt verbalizes understanding for follow up care and d/c.  VSS.  Pt left department with all belongings.  Pt consented to d/c instructions. Sig pad not working.

## 2020-10-29 NOTE — ED Triage Notes (Signed)
Pt BIB EMS for a fall. Pt states she did not realize she fell. She was trying to walk with her walker and tripped per EMS.  Pt is a/o x3 in triage and at baseline per EMS.

## 2022-04-30 IMAGING — CT CT HEAD W/O CM
4 series · 16 of 47 positions shown, 18 images · non-contrast
Comparison: October 08, 2005.

CLINICAL DATA: Fall.

EXAM:
CT HEAD WITHOUT CONTRAST
CT CERVICAL SPINE WITHOUT CONTRAST
TECHNIQUE: Multidetector CT imaging of the head and cervical spine was
performed following the standard protocol without intravenous
contrast. Multiplanar CT image reconstructions of the cervical spine
were also generated.

[Series 2: head wo · axial · 0.41mm/px · z∈[+534,+644]mm · 7 of 30 slices shown, 9 images]
[im 4/30  brain]
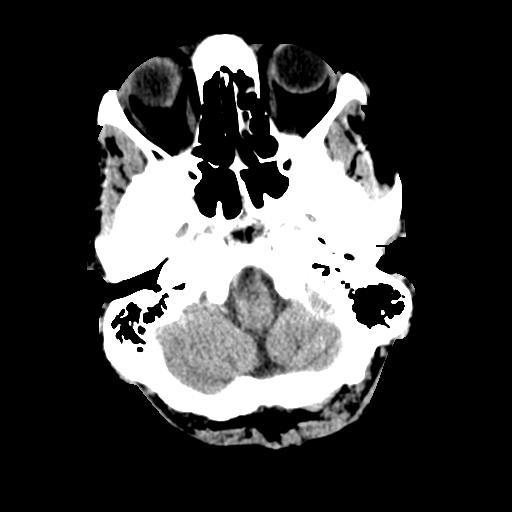
[im 4/30  bone]
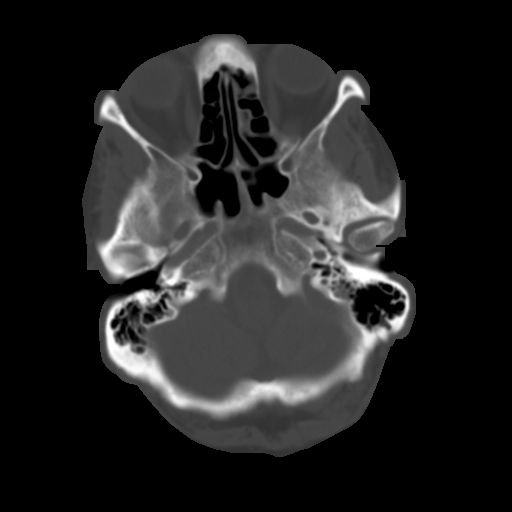
[im 8/30  brain]
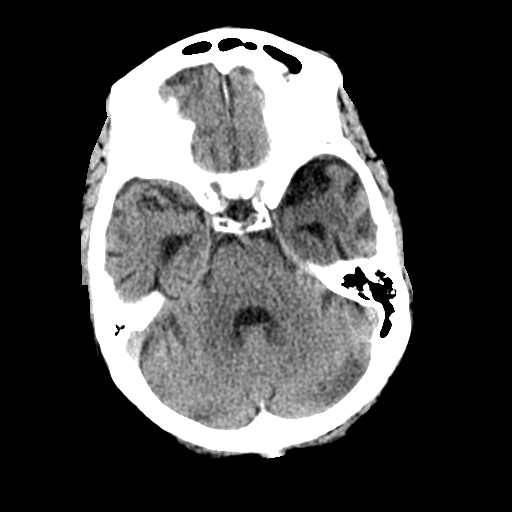
[im 11/30  brain]
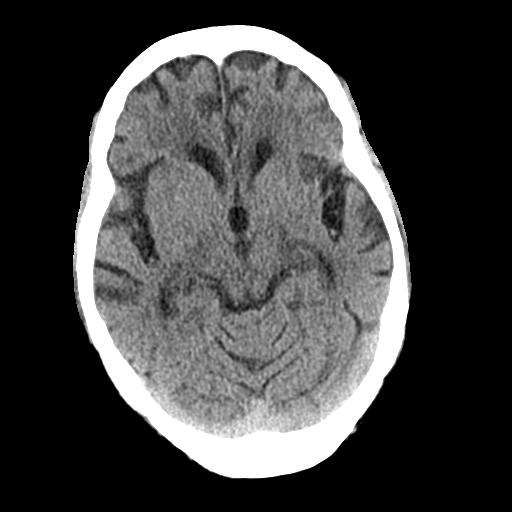
[im 15/30  brain]
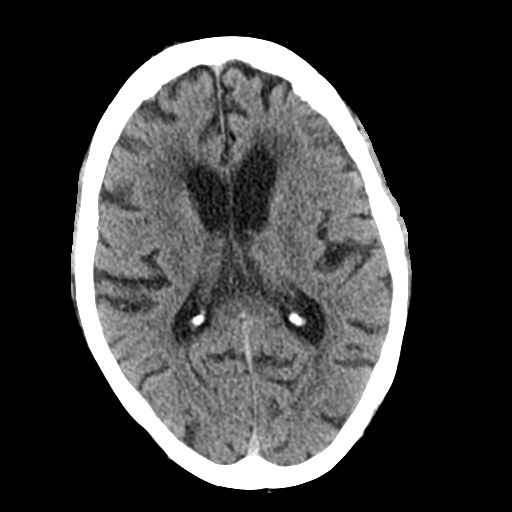
[im 19/30  brain]
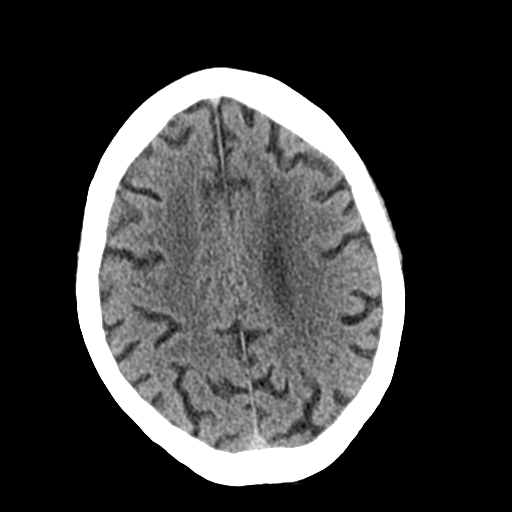
[im 19/30  bone]
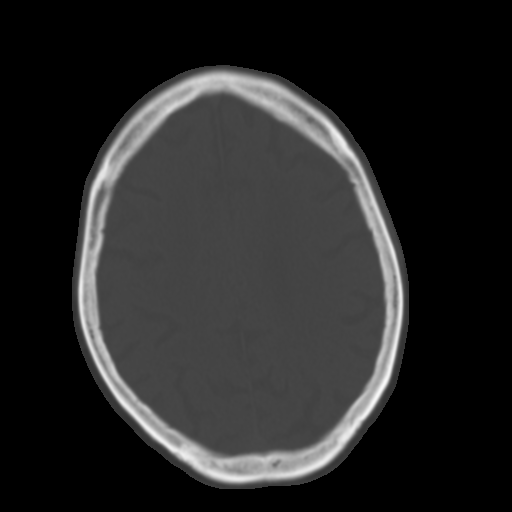
[im 22/30  brain]
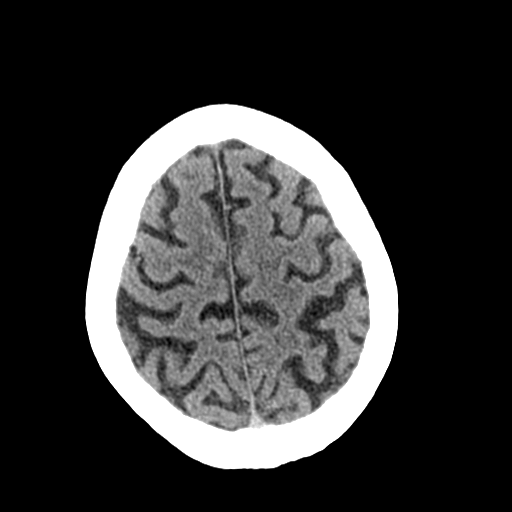
[im 26/30  brain]
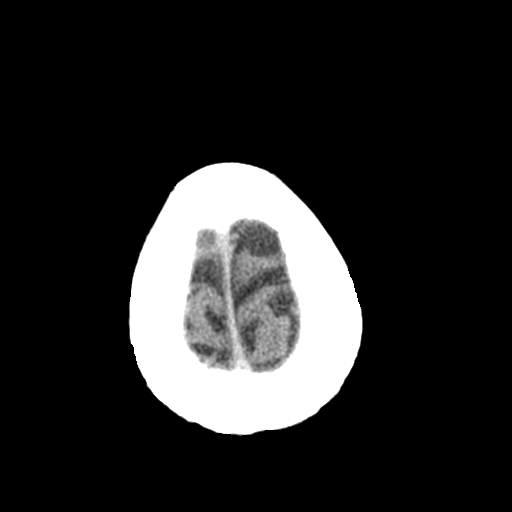

[Series 3: head bone · axial · 0.41mm/px · z∈[+533,+561]mm · 3 of 74 slices shown]
[im 8/74  bone]
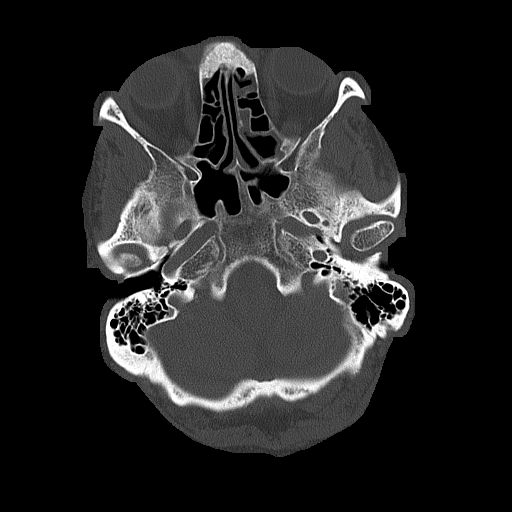
[im 15/74  bone]
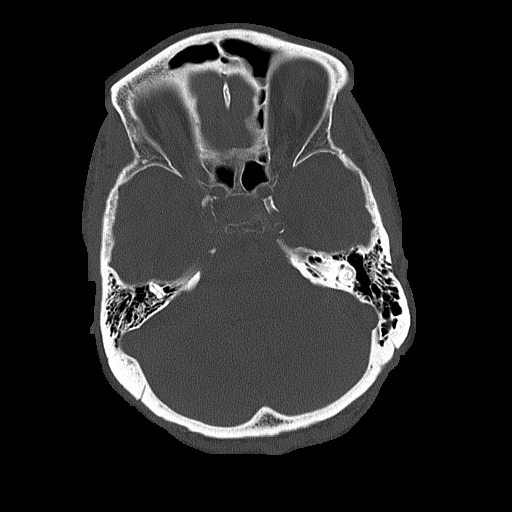
[im 22/74  bone]
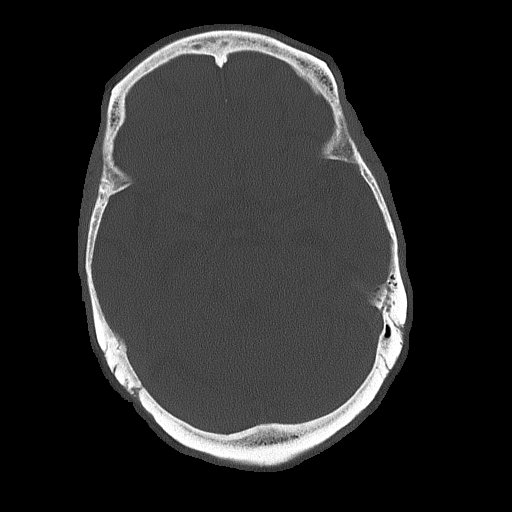

[Series 4: coronal soft tissue · coronal · 0.31mm/px · 3 of 68 slices shown]
[im 23/68  brain]
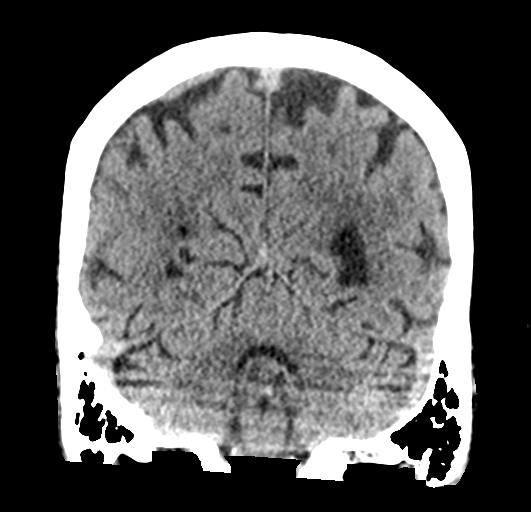
[im 30/68  brain]
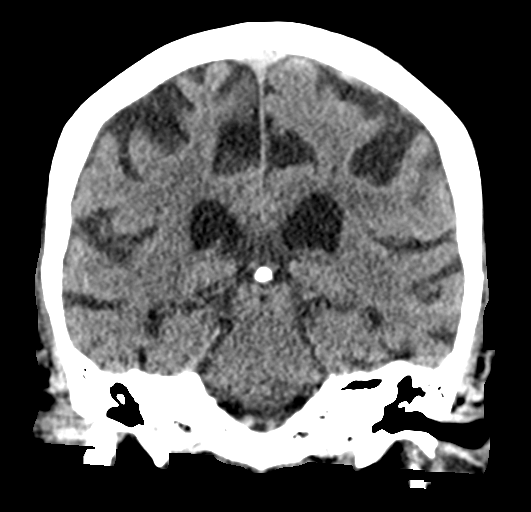
[im 38/68  brain]
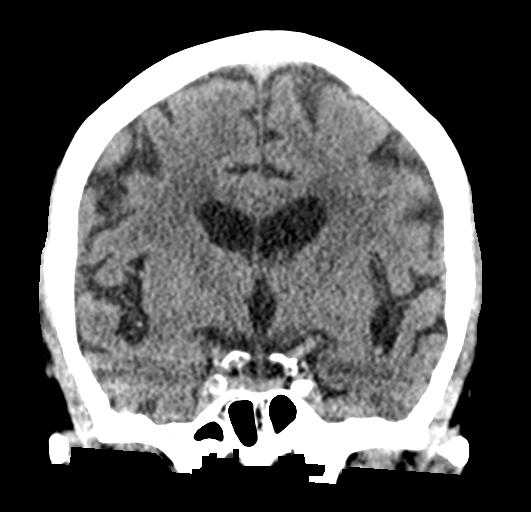

[Series 5: sagittal soft tissue · sagittal · 0.31mm/px · 3 of 54 slices shown]
[im 18/54  brain]
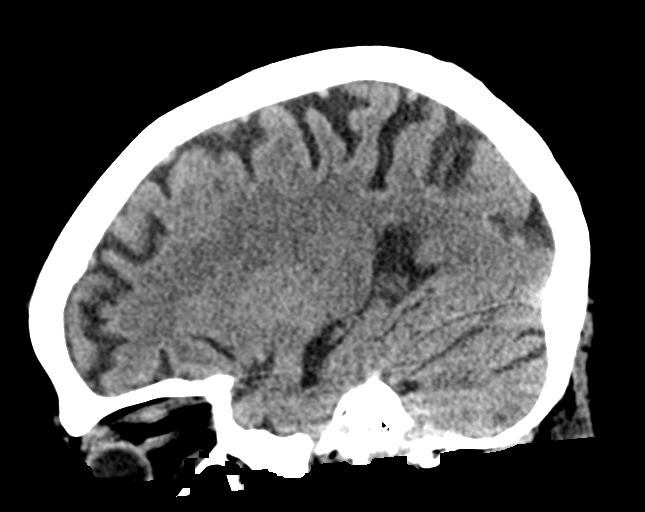
[im 27/54  brain]
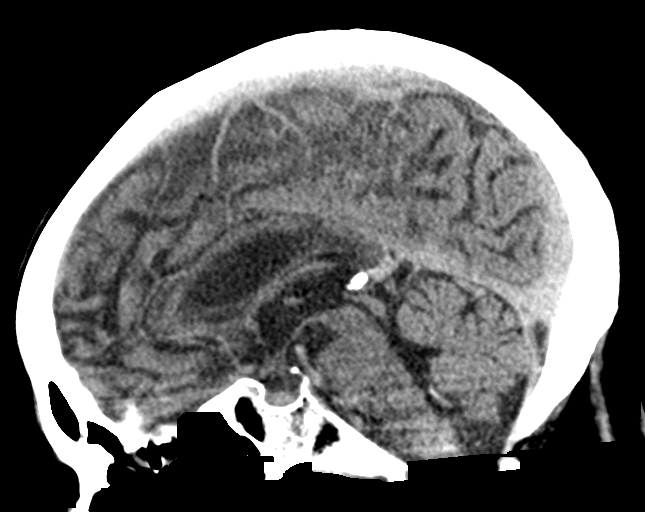
[im 36/54  brain]
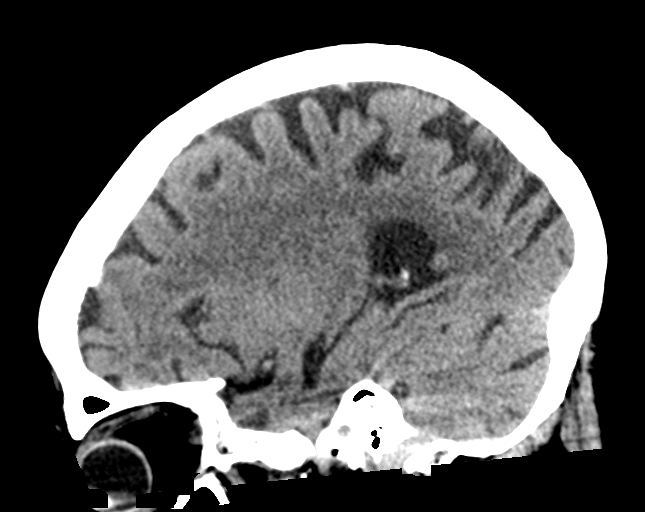

[16 of 47 positions shown; findings below may reference images not displayed]

FINDINGS: CT HEAD FINDINGS

Brain: No evidence of acute infarction, hemorrhage, hydrocephalus,
extra-axial collection or mass lesion/mass effect.

Vascular: No hyperdense vessel or unexpected calcification.

Skull: Normal. Negative for fracture or focal lesion.

Sinuses/Orbits: No acute finding.

Other: None.

CT CERVICAL SPINE FINDINGS

Alignment: Normal.

Skull base and vertebrae: No acute fracture. No primary bone lesion
or focal pathologic process.

Soft tissues and spinal canal: No prevertebral fluid or swelling. No
visible canal hematoma. 2.3 cm left thyroid nodule is noted.

Disc levels: Disc spaces are well-maintained. Moderate anterior
osteophyte formation is noted at C4-5, C5-6 and C6-7.

Upper chest: Negative.

Other: None.
IMPRESSION: No acute intracranial abnormality seen.

Multilevel degenerative changes are noted. No acute abnormality seen
in the cervical spine.

2.3 cm left thyroid nodule. Recommend thyroid US. (Ref: [HOSPITAL]. [DATE]): 143-50).

## 2022-04-30 IMAGING — CT CT CERVICAL SPINE W/O CM
3 of 4 series · 12 of 33 positions shown, 14 images · non-contrast
Comparison: October 08, 2005.

CLINICAL DATA: Fall.

EXAM:
CT HEAD WITHOUT CONTRAST
CT CERVICAL SPINE WITHOUT CONTRAST
TECHNIQUE: Multidetector CT imaging of the head and cervical spine was
performed following the standard protocol without intravenous
contrast. Multiplanar CT image reconstructions of the cervical spine
were also generated.

[Series 6: sagittal bone · sagittal · 0.24mm/px · 5 of 53 slices shown, 6 images]
[im 18/53  bone]
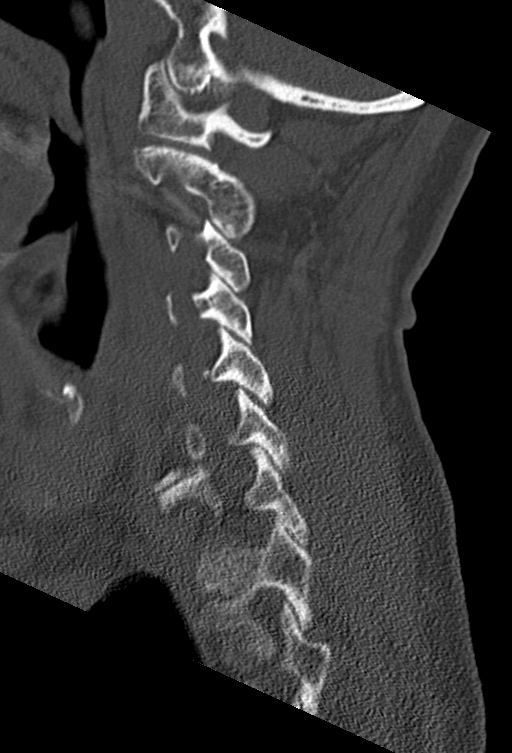
[im 22/53  bone]
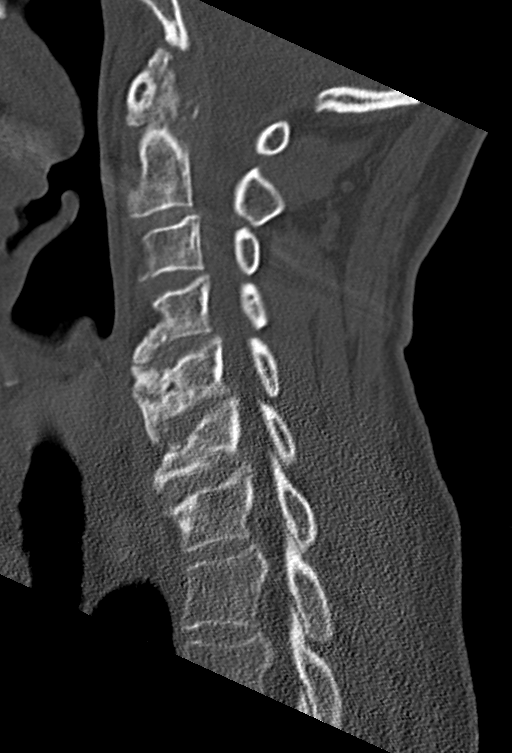
[im 27/53  soft-tissue]
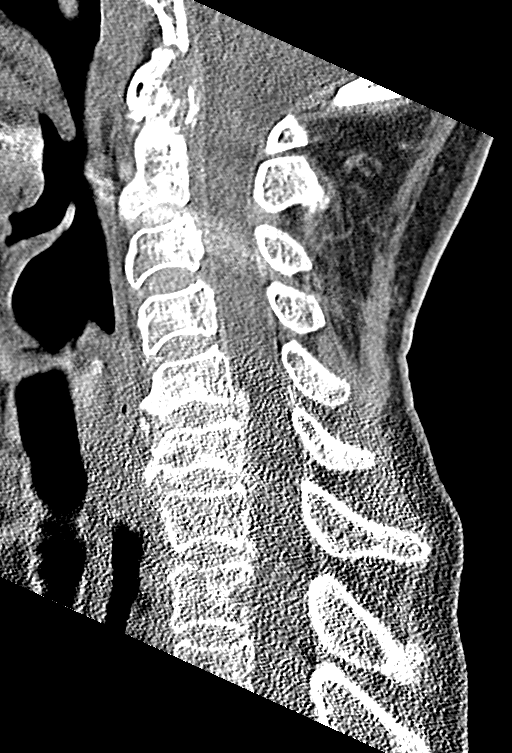
[im 27/53  bone]
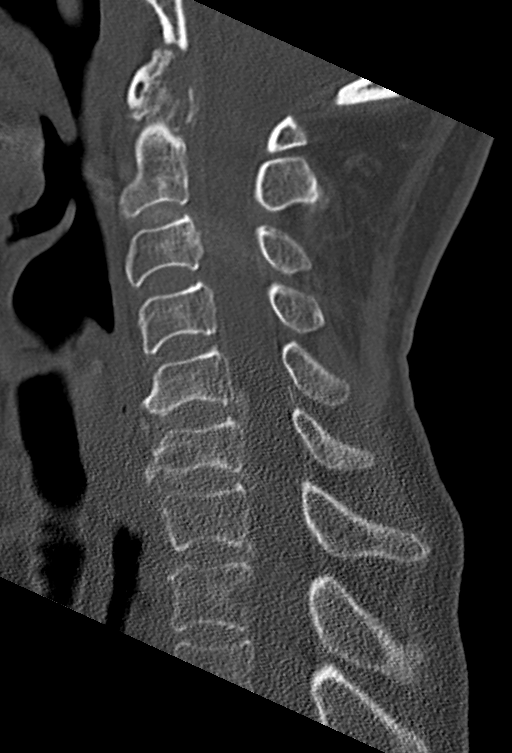
[im 31/53  bone]
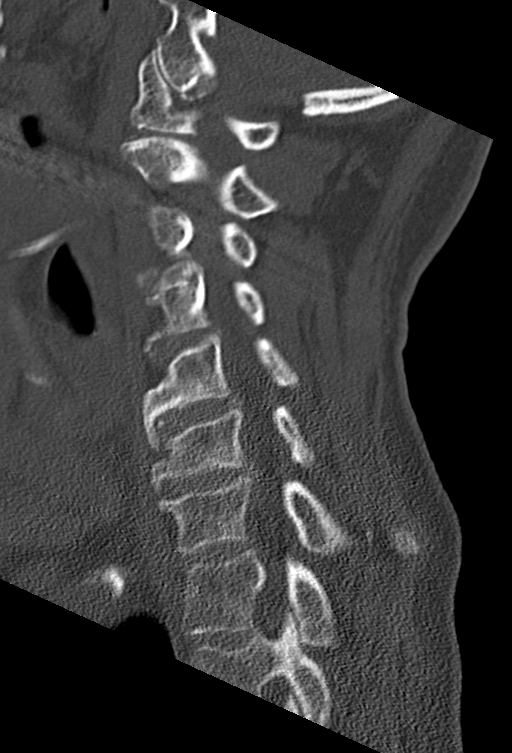
[im 35/53  bone]
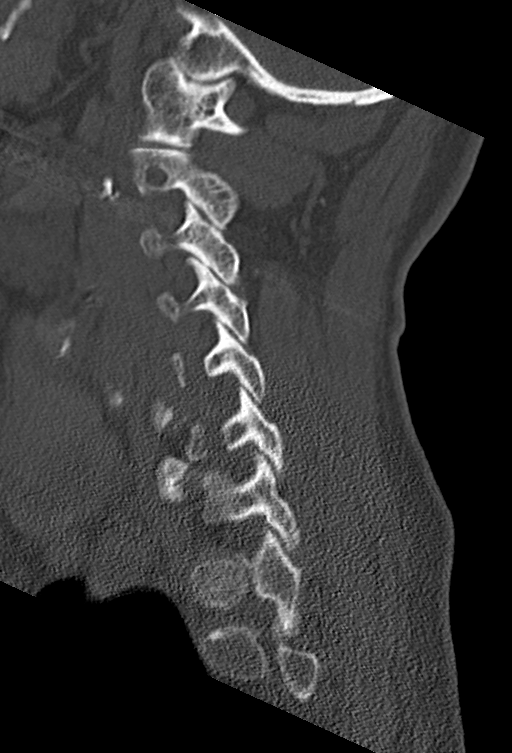

[Series 7: coronal bone · coronal · 0.21mm/px · 3 of 61 slices shown]
[im 13/61  bone]
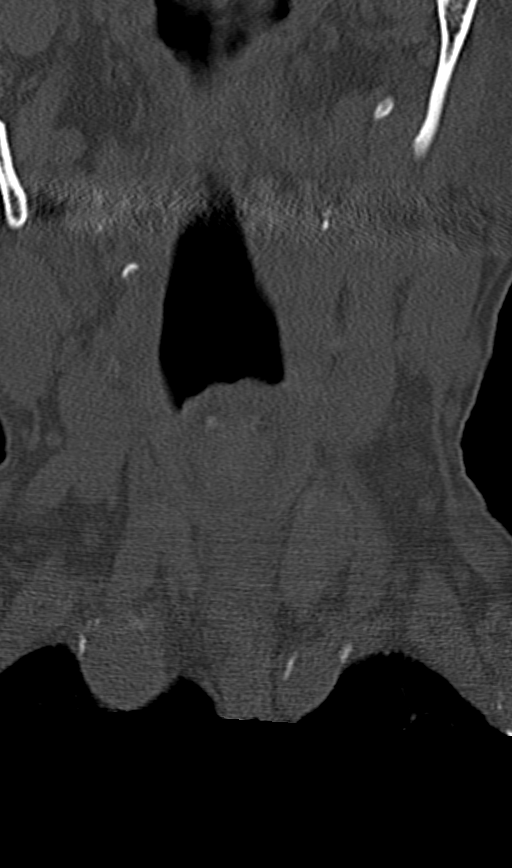
[im 25/61  bone]
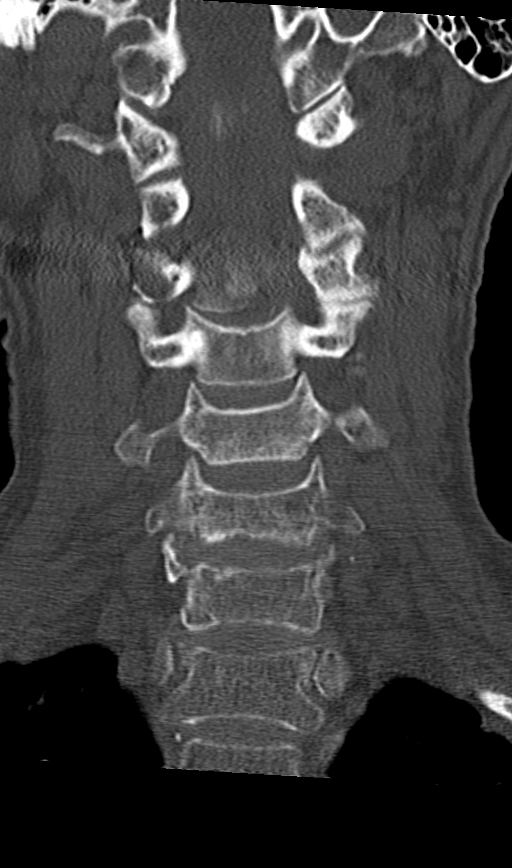
[im 37/61  bone]
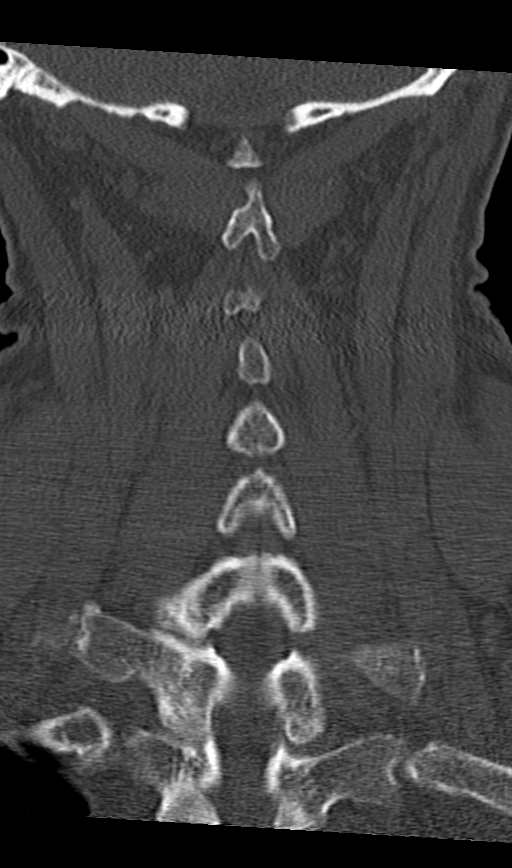

[Series 8: orthogonal bone · axial · 0.21mm/px · z∈[+352,+468]mm · 4 of 90 slices shown, 5 images]
[im 13/90  soft-tissue]
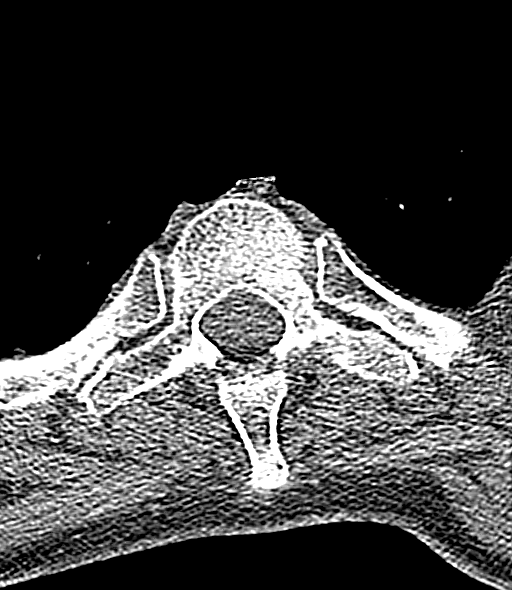
[im 13/90  bone]
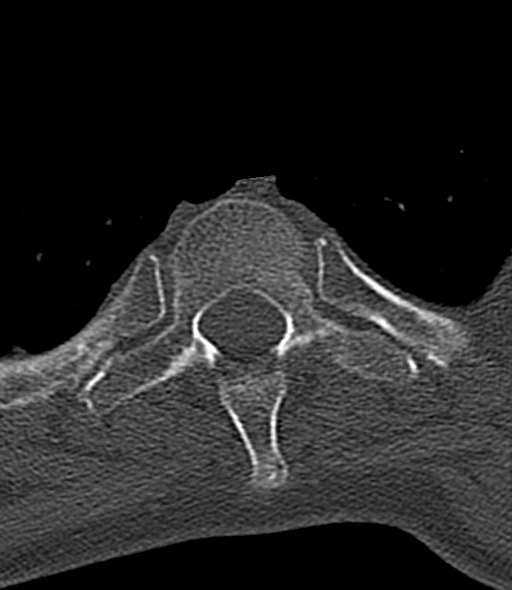
[im 39/90  bone]
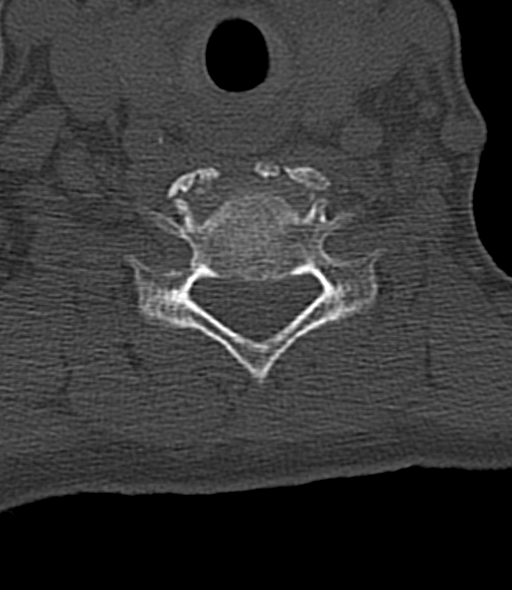
[im 51/90  bone]
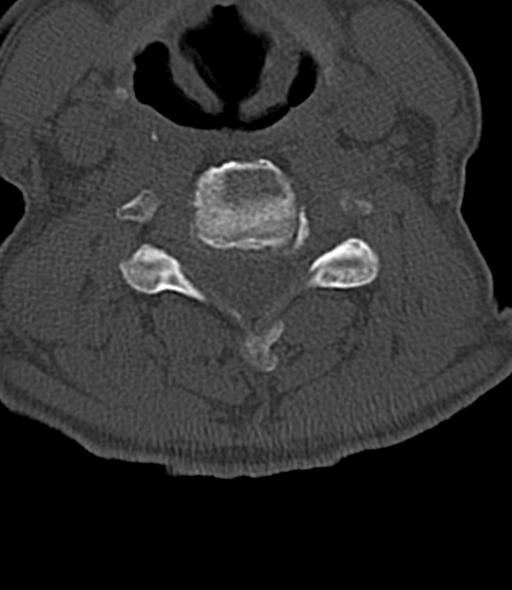
[im 77/90  bone]
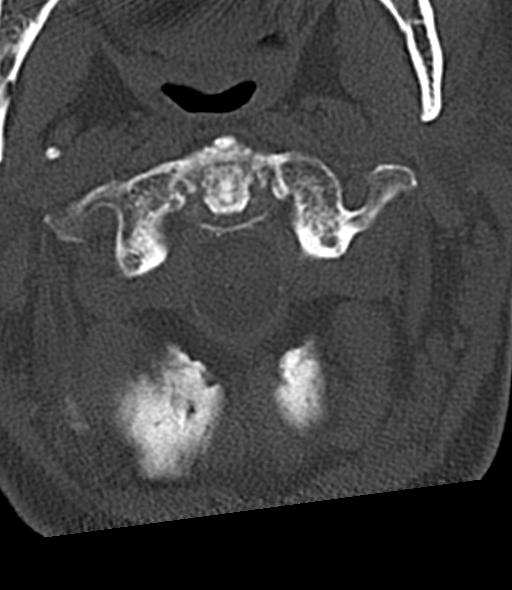

[12 of 33 positions shown; findings below may reference images not displayed]

FINDINGS: CT HEAD FINDINGS

Brain: No evidence of acute infarction, hemorrhage, hydrocephalus,
extra-axial collection or mass lesion/mass effect.

Vascular: No hyperdense vessel or unexpected calcification.

Skull: Normal. Negative for fracture or focal lesion.

Sinuses/Orbits: No acute finding.

Other: None.

CT CERVICAL SPINE FINDINGS

Alignment: Normal.

Skull base and vertebrae: No acute fracture. No primary bone lesion
or focal pathologic process.

Soft tissues and spinal canal: No prevertebral fluid or swelling. No
visible canal hematoma. 2.3 cm left thyroid nodule is noted.

Disc levels: Disc spaces are well-maintained. Moderate anterior
osteophyte formation is noted at C4-5, C5-6 and C6-7.

Upper chest: Negative.

Other: None.
IMPRESSION: No acute intracranial abnormality seen.

Multilevel degenerative changes are noted. No acute abnormality seen
in the cervical spine.

2.3 cm left thyroid nodule. Recommend thyroid US. (Ref: [HOSPITAL]. [DATE]): 143-50).

## 2022-09-02 DEATH — deceased
